# Patient Record
Sex: Female | Born: 1980 | Race: White | Hispanic: No | Marital: Married | Smoking: Never smoker
Health system: Southern US, Community
[De-identification: ages and names within clinical notes are randomized; demographics above are authoritative.]

## PROBLEM LIST (undated history)

## (undated) DIAGNOSIS — J45909 Unspecified asthma, uncomplicated: Secondary | ICD-10-CM

## (undated) DIAGNOSIS — D649 Anemia, unspecified: Secondary | ICD-10-CM

## (undated) DIAGNOSIS — R519 Headache, unspecified: Secondary | ICD-10-CM

## (undated) DIAGNOSIS — N2889 Other specified disorders of kidney and ureter: Secondary | ICD-10-CM

## (undated) DIAGNOSIS — L709 Acne, unspecified: Secondary | ICD-10-CM

## (undated) HISTORY — PX: WISDOM TOOTH EXTRACTION: SHX21

---

## 2010-05-30 ENCOUNTER — Inpatient Hospital Stay (HOSPITAL_COMMUNITY): Admission: RE | Admit: 2010-05-30 | Discharge: 2010-06-01 | Payer: Self-pay | Admitting: Obstetrics and Gynecology

## 2011-01-21 LAB — CBC
Hemoglobin: 9.9 g/dL — ABNORMAL LOW (ref 12.0–15.0)
MCH: 27.3 pg (ref 26.0–34.0)
Platelets: 150 10*3/uL (ref 150–400)
Platelets: 194 10*3/uL (ref 150–400)
RBC: 3.63 MIL/uL — ABNORMAL LOW (ref 3.87–5.11)
RBC: 4.66 MIL/uL (ref 3.87–5.11)
RDW: 16.4 % — ABNORMAL HIGH (ref 11.5–15.5)
WBC: 14.9 10*3/uL — ABNORMAL HIGH (ref 4.0–10.5)
WBC: 9.7 10*3/uL (ref 4.0–10.5)

## 2011-01-21 LAB — RPR: RPR Ser Ql: NONREACTIVE

## 2012-11-06 NOTE — L&D Delivery Note (Signed)
Delivery Note At 10:07 PM a viable female was delivered via Vaginal, Spontaneous Delivery  Apgars 9 9  Moderate Shoulder dystocia relieved with mcroberts and suprapubic press  Placenta status spontaneously  Intact 3 vessel cord   Anesthesia: Epidural  Episiotomy: midline Lacerations: none Suture Repair: 2.0 3.0 chromic vicryl Est. Blood Loss (mL): 300  Mom to postpartum.  Baby to nursery-stable.  Brandi Suarez L 08/12/2013, 10:20 PM

## 2013-01-03 LAB — OB RESULTS CONSOLE ANTIBODY SCREEN: Antibody Screen: NEGATIVE

## 2013-01-03 LAB — OB RESULTS CONSOLE ABO/RH

## 2013-01-03 LAB — OB RESULTS CONSOLE RUBELLA ANTIBODY, IGM: Rubella: IMMUNE

## 2013-01-15 LAB — OB RESULTS CONSOLE GC/CHLAMYDIA
Chlamydia: NEGATIVE
Gonorrhea: NEGATIVE

## 2013-07-08 LAB — OB RESULTS CONSOLE GBS: GBS: NEGATIVE

## 2013-08-12 ENCOUNTER — Encounter (HOSPITAL_COMMUNITY): Payer: Self-pay | Admitting: Anesthesiology

## 2013-08-12 ENCOUNTER — Encounter (HOSPITAL_COMMUNITY): Payer: Self-pay | Admitting: *Deleted

## 2013-08-12 ENCOUNTER — Inpatient Hospital Stay (HOSPITAL_COMMUNITY)
Admission: AD | Admit: 2013-08-12 | Discharge: 2013-08-14 | DRG: 373 | Disposition: A | Discharge status: Home / Self Care | Payer: BC Managed Care – PPO | Source: Ambulatory Visit | Attending: Obstetrics and Gynecology | Admitting: Obstetrics and Gynecology

## 2013-08-12 ENCOUNTER — Inpatient Hospital Stay (HOSPITAL_COMMUNITY): Payer: BC Managed Care – PPO | Admitting: Anesthesiology

## 2013-08-12 DIAGNOSIS — O3660X0 Maternal care for excessive fetal growth, unspecified trimester, not applicable or unspecified: Principal | ICD-10-CM | POA: Diagnosis present

## 2013-08-12 DIAGNOSIS — O360990 Maternal care for other rhesus isoimmunization, unspecified trimester, not applicable or unspecified: Secondary | ICD-10-CM | POA: Diagnosis present

## 2013-08-12 HISTORY — DX: Unspecified asthma, uncomplicated: J45.909

## 2013-08-12 LAB — CBC
HCT: 36.5 % (ref 36.0–46.0)
MCV: 74.6 fL — ABNORMAL LOW (ref 78.0–100.0)
RBC: 4.89 MIL/uL (ref 3.87–5.11)
RDW: 14.8 % (ref 11.5–15.5)
WBC: 12.5 10*3/uL — ABNORMAL HIGH (ref 4.0–10.5)

## 2013-08-12 MED ORDER — DIPHENHYDRAMINE HCL 50 MG/ML IJ SOLN: 12.5000 mg | INTRAMUSCULAR | Status: DC | PRN

## 2013-08-12 MED ORDER — LIDOCAINE HCL (PF) 1 % IJ SOLN
30.0000 mL | INTRAMUSCULAR | Status: DC | PRN
  Administered 2013-08-12: 30 mL via SUBCUTANEOUS
  Filled 2013-08-12 (×2): qty 30

## 2013-08-12 MED ORDER — PHENYLEPHRINE 40 MCG/ML (10ML) SYRINGE FOR IV PUSH (FOR BLOOD PRESSURE SUPPORT)
80.0000 ug | PREFILLED_SYRINGE | INTRAVENOUS | Status: DC | PRN
  Filled 2013-08-12: qty 5

## 2013-08-12 MED ORDER — SODIUM BICARBONATE 8.4 % IV SOLN
INTRAVENOUS | Status: DC | PRN
  Administered 2013-08-12: 5 mL via EPIDURAL
  Administered 2013-08-12: 2 mL via EPIDURAL

## 2013-08-12 MED ORDER — BISACODYL 10 MG RE SUPP
10.0000 mg | Freq: Every day | RECTAL | Status: DC | PRN
  Filled 2013-08-12: qty 1

## 2013-08-12 MED ORDER — MEDROXYPROGESTERONE ACETATE 150 MG/ML IM SUSP: 150.0000 mg | INTRAMUSCULAR | Status: DC | PRN

## 2013-08-12 MED ORDER — LANOLIN HYDROUS EX OINT: TOPICAL_OINTMENT | CUTANEOUS | Status: DC | PRN

## 2013-08-12 MED ORDER — FLEET ENEMA 7-19 GM/118ML RE ENEM: 1.0000 | ENEMA | Freq: Every day | RECTAL | Status: DC | PRN

## 2013-08-12 MED ORDER — OXYTOCIN BOLUS FROM INFUSION
500.0000 mL | INTRAVENOUS | Status: DC
  Administered 2013-08-12: 500 mL via INTRAVENOUS

## 2013-08-12 MED ORDER — DIPHENHYDRAMINE HCL 25 MG PO CAPS: 25.0000 mg | ORAL_CAPSULE | Freq: Four times a day (QID) | ORAL | Status: DC | PRN

## 2013-08-12 MED ORDER — EPHEDRINE 5 MG/ML INJ
10.0000 mg | INTRAVENOUS | Status: DC | PRN
  Filled 2013-08-12: qty 4

## 2013-08-12 MED ORDER — SIMETHICONE 80 MG PO CHEW: 80.0000 mg | CHEWABLE_TABLET | ORAL | Status: DC | PRN

## 2013-08-12 MED ORDER — OXYCODONE-ACETAMINOPHEN 5-325 MG PO TABS: 1.0000 | ORAL_TABLET | ORAL | Status: DC | PRN

## 2013-08-12 MED ORDER — DIBUCAINE 1 % RE OINT
1.0000 "application " | TOPICAL_OINTMENT | RECTAL | Status: DC | PRN
  Filled 2013-08-12: qty 28

## 2013-08-12 MED ORDER — LACTATED RINGERS IV SOLN: 500.0000 mL | INTRAVENOUS | Status: DC | PRN

## 2013-08-12 MED ORDER — BUPIVACAINE HCL (PF) 0.25 % IJ SOLN
INTRAMUSCULAR | Status: DC | PRN
  Administered 2013-08-12: 2 mL

## 2013-08-12 MED ORDER — ONDANSETRON HCL 4 MG PO TABS: 4.0000 mg | ORAL_TABLET | ORAL | Status: DC | PRN

## 2013-08-12 MED ORDER — PHENYLEPHRINE 40 MCG/ML (10ML) SYRINGE FOR IV PUSH (FOR BLOOD PRESSURE SUPPORT): 80.0000 ug | PREFILLED_SYRINGE | INTRAVENOUS | Status: DC | PRN

## 2013-08-12 MED ORDER — SENNOSIDES-DOCUSATE SODIUM 8.6-50 MG PO TABS: 2.0000 | ORAL_TABLET | ORAL | Status: DC

## 2013-08-12 MED ORDER — PRENATAL MULTIVITAMIN CH
1.0000 | ORAL_TABLET | Freq: Every day | ORAL | Status: DC
  Administered 2013-08-13: 1 via ORAL

## 2013-08-12 MED ORDER — OXYTOCIN 40 UNITS IN LACTATED RINGERS INFUSION - SIMPLE MED
62.5000 mL/h | INTRAVENOUS | Status: DC
Start: 2013-08-12 — End: 2013-08-12
  Administered 2013-08-12: 62.5 mL/h via INTRAVENOUS
  Filled 2013-08-12: qty 1000

## 2013-08-12 MED ORDER — MEASLES, MUMPS & RUBELLA VAC ~~LOC~~ INJ
0.5000 mL | INJECTION | Freq: Once | SUBCUTANEOUS | Status: DC
  Filled 2013-08-12: qty 0.5

## 2013-08-12 MED ORDER — CITRIC ACID-SODIUM CITRATE 334-500 MG/5ML PO SOLN: 30.0000 mL | ORAL | Status: DC | PRN

## 2013-08-12 MED ORDER — BENZOCAINE-MENTHOL 20-0.5 % EX AERO
1.0000 "application " | INHALATION_SPRAY | CUTANEOUS | Status: DC | PRN
  Administered 2013-08-13: 1 via TOPICAL
  Filled 2013-08-12 (×2): qty 56

## 2013-08-12 MED ORDER — LACTATED RINGERS IV SOLN
INTRAVENOUS | Status: DC
  Administered 2013-08-12 (×2): via INTRAVENOUS

## 2013-08-12 MED ORDER — ZOLPIDEM TARTRATE 5 MG PO TABS: 5.0000 mg | ORAL_TABLET | Freq: Every evening | ORAL | Status: DC | PRN

## 2013-08-12 MED ORDER — IBUPROFEN 600 MG PO TABS: 600.0000 mg | ORAL_TABLET | Freq: Four times a day (QID) | ORAL | Status: DC | PRN

## 2013-08-12 MED ORDER — ACETAMINOPHEN 325 MG PO TABS: 650.0000 mg | ORAL_TABLET | ORAL | Status: DC | PRN

## 2013-08-12 MED ORDER — IBUPROFEN 600 MG PO TABS
600.0000 mg | ORAL_TABLET | Freq: Four times a day (QID) | ORAL | Status: DC
  Administered 2013-08-13 – 2013-08-14 (×6): 600 mg via ORAL
  Filled 2013-08-12 (×5): qty 1

## 2013-08-12 MED ORDER — TETANUS-DIPHTH-ACELL PERTUSSIS 5-2.5-18.5 LF-MCG/0.5 IM SUSP
0.5000 mL | Freq: Once | INTRAMUSCULAR | Status: DC
  Filled 2013-08-12: qty 0.5

## 2013-08-12 MED ORDER — FENTANYL 2.5 MCG/ML BUPIVACAINE 1/10 % EPIDURAL INFUSION (WH - ANES)
14.0000 mL/h | INTRAMUSCULAR | Status: DC | PRN
  Administered 2013-08-12 (×2): 14 mL/h via EPIDURAL
  Filled 2013-08-12 (×2): qty 125

## 2013-08-12 MED ORDER — LACTATED RINGERS IV SOLN
500.0000 mL | Freq: Once | INTRAVENOUS | Status: AC
  Administered 2013-08-12: 500 mL via INTRAVENOUS

## 2013-08-12 MED ORDER — ONDANSETRON HCL 4 MG/2ML IJ SOLN: 4.0000 mg | Freq: Four times a day (QID) | INTRAMUSCULAR | Status: DC | PRN

## 2013-08-12 MED ORDER — ONDANSETRON HCL 4 MG/2ML IJ SOLN: 4.0000 mg | INTRAMUSCULAR | Status: DC | PRN

## 2013-08-12 MED ORDER — WITCH HAZEL-GLYCERIN EX PADS: 1.0000 "application " | MEDICATED_PAD | CUTANEOUS | Status: DC | PRN

## 2013-08-12 MED ORDER — EPHEDRINE 5 MG/ML INJ: 10.0000 mg | INTRAVENOUS | Status: DC | PRN

## 2013-08-12 NOTE — H&P (Signed)
Brandi Suarez is a 32 y.o. female presenting for onset of labor. Ultrasound today in office EFW is 4211 gram. Patient counseled about possibility of LGA and shoulder dystocia and very much wants to have vaginal delivery. Was 5 to 6 cm dilated in the office. Now status post Epidural. Maternal Medical History:  Reason for admission: Contractions.   Contractions: Frequency: regular.   Perceived severity is moderate.    Fetal activity: Perceived fetal activity is normal.    Prenatal complications: no prenatal complications   OB History   Grav Para Term Preterm Abortions TAB SAB Ect Mult Living   2 1 1       1      Past Medical History  Diagnosis Date  . Asthma     had when younger "outgrown" now   History reviewed. No pertinent past surgical history. Family History: family history is not on file. Social History:  reports that she has never smoked. She has never used smokeless tobacco. She reports that she does not drink alcohol or use illicit drugs.   Prenatal Transfer Tool  Maternal Diabetes: No Genetic Screening: Normal Maternal Ultrasounds/Referrals: Normal Fetal Ultrasounds or other Referrals:  None Maternal Substance Abuse:  No Significant Maternal Medications:  None Significant Maternal Lab Results:  None Other Comments:  None  Review of Systems  All other systems reviewed and are negative.    Dilation: 7.5 Effacement (%): 90 Station: -2 Exam by:: Dr. Vincente Poli Blood pressure 120/66, pulse 78, temperature 98.8 F (37.1 C), temperature source Oral, resp. rate 18, height 5' 2.5" (1.588 m), weight 75.297 kg (166 lb), last menstrual period 11/07/2012. Maternal Exam:  Uterine Assessment: Contraction strength is moderate.  Contraction frequency is regular.   Abdomen: Estimated fetal weight is 4211 gram by ultrasound today.   Fetal presentation: vertex  Introitus: Normal vulva. Normal vagina.    Physical Exam  Nursing note and vitals reviewed. Constitutional: She  appears well-developed and well-nourished.  Cardiovascular: Normal rate.   Respiratory: Effort normal and breath sounds normal.    Prenatal labs: ABO, Rh:  RH negative  Antibody:   Rubella:  immune RPR:  neg  HBsAg:   neg HIV:   neg GBS:     Assessment/Plan: IUP at term LGA  Anticipate NSVD   Christa Fasig L 08/12/2013, 3:34 PM

## 2013-08-12 NOTE — Anesthesia Preprocedure Evaluation (Addendum)

## 2013-08-12 NOTE — Anesthesia Procedure Notes (Signed)

## 2013-08-13 LAB — CBC
HCT: 30.6 % — ABNORMAL LOW (ref 36.0–46.0)
MCHC: 32 g/dL (ref 30.0–36.0)
MCV: 73.9 fL — ABNORMAL LOW (ref 78.0–100.0)
Platelets: 207 10*3/uL (ref 150–400)
RBC: 4.14 MIL/uL (ref 3.87–5.11)
WBC: 21.4 10*3/uL — ABNORMAL HIGH (ref 4.0–10.5)

## 2013-08-13 MED ORDER — INFLUENZA VAC SPLIT QUAD 0.5 ML IM SUSP
0.5000 mL | INTRAMUSCULAR | Status: AC
  Administered 2013-08-14: 0.5 mL via INTRAMUSCULAR
  Filled 2013-08-13: qty 0.5

## 2013-08-13 NOTE — Progress Notes (Signed)
Post Partum Day 1 Subjective: up ad lib, voiding, tolerating PO and complains of mild pulling sensation RLQ  Objective: Blood pressure 132/84, pulse 60, temperature 97.7 F (36.5 C), temperature source Axillary, resp. rate 18, height 5' 2.5" (1.588 m), weight 166 lb (75.297 kg), last menstrual period 11/07/2012, SpO2 98.00%, unknown if currently breastfeeding.  Physical Exam:  General: alert and cooperative Lochia: appropriate Uterine Fundus: firm Incision: perineum intact DVT Evaluation: No evidence of DVT seen on physical exam. Negative Homan's sign. No cords or calf tenderness. No significant calf/ankle edema.   Recent Labs  08/12/13 1400 08/13/13 0550  HGB 11.7* 9.8*  HCT 36.5 30.6*    Assessment/Plan: Plan for discharge tomorrow and Circumcision prior to discharge  K pad if desired   LOS: 1 day   Ariyah Sedlack G 08/13/2013, 8:43 AM

## 2013-08-13 NOTE — Anesthesia Postprocedure Evaluation (Signed)
Anesthesia Post Note  Patient: Brandi Suarez  Procedure(s) Performed: * No procedures listed *  Anesthesia type: Epidural  Patient location: Mother/Baby  Post pain: Pain level controlled  Post assessment: Post-op Vital signs reviewed  Last Vitals:  Filed Vitals:   08/13/13 0605  BP: 132/84  Pulse: 60  Temp: 36.5 C  Resp: 18    Post vital signs: Reviewed  Level of consciousness:alert  Complications: No apparent anesthesia complications

## 2013-08-13 NOTE — Lactation Note (Signed)
This note was copied from the chart of Brandi Suarez. Lactation Consultation Note  Patient Name: Brandi Suarez [id number] Date: 08/13/2013 Reason for consult: Initial assessment Mom reports baby has latched well at least 3 times since birth. He had his circumcision this afternoon and is asleep at this visit. BF basics reviewed with Mom. Encouraged to BF with feeding ques, at least every 3 hours attempt to feed baby. Cluster feeding discussed. Lactation brochure left for review. Advised of OP services and support group. Advised to call if would like LC assist.   Maternal Data Formula Feeding for Exclusion: No Infant to breast within first hour of birth: Yes Has patient been taught Hand Expression?: No (Mom reports she knows how to hand express) Does the patient have breastfeeding experience prior to this delivery?: Yes  Feeding    LATCH Score/Interventions                      Lactation Tools Discussed/Used WIC Program: No   Consult Status Consult Status: Follow-up Date: 08/14/13 Follow-up type: In-patient    Alfred Levins 08/13/2013, 2:48 PM

## 2013-08-13 NOTE — Progress Notes (Signed)
Post Partum Day 1 Subjective: no complaints, up ad lib, voiding and tolerating PO.  Patient requests circ.  Objective: Blood pressure 132/84, pulse 60, temperature 97.7 F (36.5 C), temperature source Axillary, resp. rate 18, height 5' 2.5" (1.588 m), weight 166 lb (75.297 kg), last menstrual period 11/07/2012, SpO2 98.00%, unknown if currently breastfeeding.  Physical Exam:  General: alert, cooperative and appears stated age Lochia: appropriate Uterine Fundus: firm Incision: n/a DVT Evaluation: No evidence of DVT seen on physical exam. Negative Homan's sign. No cords or calf tenderness.   Recent Labs  08/12/13 1400 08/13/13 0550  HGB 11.7* 9.8*  HCT 36.5 30.6*    Assessment/Plan: Plan for discharge tomorrow, Breastfeeding and Circumcision prior to discharge Patient and husband counseled for circumcision including risk of bleeding, infection, and scarring.  All questions were answered.  Will do today.   LOS: 1 day   Brandi Suarez 08/13/2013, 8:46 AM

## 2013-08-14 MED ORDER — IBUPROFEN 600 MG PO TABS: 600.0000 mg | ORAL_TABLET | Freq: Four times a day (QID) | ORAL | Status: DC

## 2013-08-14 MED ORDER — OXYCODONE-ACETAMINOPHEN 5-325 MG PO TABS: 1.0000 | ORAL_TABLET | ORAL | Status: DC | PRN

## 2013-08-14 NOTE — Discharge Summary (Signed)
Obstetric Discharge Summary Reason for Admission: onset of labor Prenatal Procedures: ultrasound Intrapartum Procedures: spontaneous vaginal delivery Postpartum Procedures: none Complications-Operative and Postpartum: ML episiotomy Hemoglobin  Date Value Range Status  08/13/2013 9.8* 12.0 - 15.0 g/dL Final     REPEATED TO VERIFY     HCT  Date Value Range Status  08/13/2013 30.6* 36.0 - 46.0 % Final     REPEATED TO VERIFY    Physical Exam:  General: alert and cooperative Lochia: appropriate Uterine Fundus: firm Incision: perineum intact DVT Evaluation: No evidence of DVT seen on physical exam. Negative Homan's sign. No cords or calf tenderness. No significant calf/ankle edema.  Discharge Diagnoses: Term Pregnancy-delivered  Discharge Information: Date: 08/14/2013 Activity: pelvic rest Diet: routine Medications: PNV, Ibuprofen and Percocet Condition: stable Instructions: refer to practice specific booklet Discharge to: home   Newborn Data: Live born female  Birth Weight: 9 lb 6.6 oz (4270 g) APGAR: 8, 10  Home with mother.  CURTIS,CAROL G 08/14/2013, 8:52 AM

## 2013-08-14 NOTE — Lactation Note (Signed)
This note was copied from the chart of Brandi Jeily Weissman. Lactation Consultation Note  Patient Name: Brandi Suarez RUEAV'W Date: 08/14/2013 Reason for consult: Follow-up assessment Mom reports some mild nipple tenderness, no breakdown. Care for sore nipples discussed. Advised to apply EBM for tenderness. Mom had baby latched when I arrived, demonstrated how to bring bottom lip down for deeper latch. Engorgement care reviewed if needed. Advised of OP services and support group.   Maternal Data    Feeding Feeding Type: Breast Fed Length of feed: 35 min  LATCH Score/Interventions Latch: Grasps breast easily, tongue down, lips flanged, rhythmical sucking.  Audible Swallowing: A few with stimulation  Type of Nipple: Everted at rest and after stimulation  Comfort (Breast/Nipple): Filling, red/small blisters or bruises, mild/mod discomfort  Problem noted: Mild/Moderate discomfort  Hold (Positioning): No assistance needed to correctly position infant at breast.  LATCH Score: 8  Lactation Tools Discussed/Used     Consult Status Consult Status: Complete Date: 08/14/13 Follow-up type: In-patient    Brandi Suarez 08/14/2013, 8:48 AM

## 2014-09-07 ENCOUNTER — Encounter (HOSPITAL_COMMUNITY): Payer: Self-pay | Admitting: *Deleted

## 2014-12-03 ENCOUNTER — Other Ambulatory Visit (HOSPITAL_COMMUNITY): Payer: Self-pay | Admitting: Obstetrics and Gynecology

## 2014-12-03 DIAGNOSIS — R109 Unspecified abdominal pain: Secondary | ICD-10-CM

## 2014-12-07 ENCOUNTER — Ambulatory Visit (HOSPITAL_COMMUNITY)
Admission: RE | Admit: 2014-12-07 | Discharge: 2014-12-07 | Disposition: A | Discharge status: Home / Self Care | Payer: BLUE CROSS/BLUE SHIELD | Source: Ambulatory Visit | Attending: Obstetrics and Gynecology | Admitting: Obstetrics and Gynecology

## 2014-12-07 DIAGNOSIS — N2889 Other specified disorders of kidney and ureter: Secondary | ICD-10-CM | POA: Diagnosis not present

## 2014-12-07 DIAGNOSIS — R109 Unspecified abdominal pain: Secondary | ICD-10-CM | POA: Insufficient documentation

## 2015-03-09 ENCOUNTER — Other Ambulatory Visit (HOSPITAL_COMMUNITY): Payer: Self-pay | Admitting: Urology

## 2015-03-09 DIAGNOSIS — Q6239 Other obstructive defects of renal pelvis and ureter: Secondary | ICD-10-CM

## 2015-03-09 DIAGNOSIS — Q6211 Congenital occlusion of ureteropelvic junction: Principal | ICD-10-CM

## 2015-03-18 ENCOUNTER — Encounter (HOSPITAL_COMMUNITY): Payer: Self-pay

## 2015-03-18 ENCOUNTER — Ambulatory Visit (HOSPITAL_COMMUNITY)
Admission: RE | Admit: 2015-03-18 | Discharge: 2015-03-18 | Disposition: A | Discharge status: Home / Self Care | Payer: BLUE CROSS/BLUE SHIELD | Source: Ambulatory Visit | Attending: Urology | Admitting: Urology

## 2015-03-18 DIAGNOSIS — Q6239 Other obstructive defects of renal pelvis and ureter: Secondary | ICD-10-CM

## 2015-03-18 DIAGNOSIS — Q6211 Congenital occlusion of ureteropelvic junction: Secondary | ICD-10-CM

## 2015-03-18 DIAGNOSIS — R109 Unspecified abdominal pain: Secondary | ICD-10-CM | POA: Insufficient documentation

## 2015-03-18 MED ORDER — FUROSEMIDE 10 MG/ML IJ SOLN
28.6000 mg | Freq: Once | INTRAMUSCULAR | Status: AC
  Administered 2015-03-18: 28.6 mg via INTRAVENOUS
  Filled 2015-03-18: qty 4

## 2015-03-18 MED ORDER — TECHNETIUM TC 99M MERTIATIDE
15.0000 | Freq: Once | INTRAVENOUS | Status: AC | PRN
  Administered 2015-03-18: 15.1 via INTRAVENOUS

## 2015-03-18 MED ORDER — FUROSEMIDE 10 MG/ML IJ SOLN: 37.7000 mg | Freq: Once | INTRAMUSCULAR | Status: DC

## 2016-01-13 ENCOUNTER — Other Ambulatory Visit: Payer: Self-pay | Admitting: Urology

## 2016-02-01 ENCOUNTER — Encounter (HOSPITAL_BASED_OUTPATIENT_CLINIC_OR_DEPARTMENT_OTHER): Payer: Self-pay | Admitting: *Deleted

## 2016-02-01 NOTE — Progress Notes (Signed)
NPO AFTER MN.  ARRIVE AT [id number].  NEEDS ISTAT 8  AND URINE PREG.

## 2016-02-04 ENCOUNTER — Encounter (HOSPITAL_BASED_OUTPATIENT_CLINIC_OR_DEPARTMENT_OTHER)
Admission: RE | Disposition: A | Discharge status: Home / Self Care | Payer: Self-pay | Source: Ambulatory Visit | Attending: Urology

## 2016-02-04 ENCOUNTER — Ambulatory Visit (HOSPITAL_BASED_OUTPATIENT_CLINIC_OR_DEPARTMENT_OTHER)
Admission: RE | Admit: 2016-02-04 | Discharge: 2016-02-04 | Disposition: A | Discharge status: Home / Self Care | Payer: BLUE CROSS/BLUE SHIELD | Source: Ambulatory Visit | Attending: Urology | Admitting: Urology

## 2016-02-04 ENCOUNTER — Ambulatory Visit (HOSPITAL_BASED_OUTPATIENT_CLINIC_OR_DEPARTMENT_OTHER): Payer: BLUE CROSS/BLUE SHIELD | Admitting: Anesthesiology

## 2016-02-04 ENCOUNTER — Encounter (HOSPITAL_BASED_OUTPATIENT_CLINIC_OR_DEPARTMENT_OTHER): Payer: Self-pay | Admitting: *Deleted

## 2016-02-04 DIAGNOSIS — N132 Hydronephrosis with renal and ureteral calculous obstruction: Secondary | ICD-10-CM | POA: Insufficient documentation

## 2016-02-04 DIAGNOSIS — Q625 Duplication of ureter: Secondary | ICD-10-CM | POA: Insufficient documentation

## 2016-02-04 DIAGNOSIS — R109 Unspecified abdominal pain: Secondary | ICD-10-CM | POA: Insufficient documentation

## 2016-02-04 HISTORY — DX: Other specified disorders of kidney and ureter: N28.89

## 2016-02-04 HISTORY — PX: CYSTOSCOPY WITH RETROGRADE PYELOGRAM, URETEROSCOPY AND STENT PLACEMENT: SHX5789

## 2016-02-04 HISTORY — DX: Acne, unspecified: L70.9

## 2016-02-04 LAB — POCT I-STAT, CHEM 8
BUN: 8 mg/dL (ref 6–20)
CALCIUM ION: 1.24 mmol/L — AB (ref 1.12–1.23)
CHLORIDE: 104 mmol/L (ref 101–111)
Creatinine, Ser: 0.7 mg/dL (ref 0.44–1.00)
Glucose, Bld: 96 mg/dL (ref 65–99)
HCT: 45 % (ref 36.0–46.0)
Hemoglobin: 15.3 g/dL — ABNORMAL HIGH (ref 12.0–15.0)
POTASSIUM: 3.9 mmol/L (ref 3.5–5.1)
SODIUM: 143 mmol/L (ref 135–145)
TCO2: 23 mmol/L (ref 0–100)

## 2016-02-04 LAB — POCT PREGNANCY, URINE: PREG TEST UR: NEGATIVE

## 2016-02-04 SURGERY — CYSTOURETEROSCOPY, WITH RETROGRADE PYELOGRAM AND STENT INSERTION
Anesthesia: General | Site: Ureter | Laterality: Right

## 2016-02-04 MED ORDER — SODIUM CHLORIDE 0.9 % IR SOLN
Status: DC | PRN
  Administered 2016-02-04: 3000 mL
  Administered 2016-02-04: 1000 mL

## 2016-02-04 MED ORDER — IOPAMIDOL (ISOVUE-300) INJECTION 61%
INTRAVENOUS | Status: DC | PRN
  Administered 2016-02-04: 10 mL

## 2016-02-04 MED ORDER — OXYCODONE-ACETAMINOPHEN 5-325 MG PO TABS: 1.0000 | ORAL_TABLET | Freq: Four times a day (QID) | ORAL | Status: DC | PRN

## 2016-02-04 MED ORDER — ONDANSETRON HCL 4 MG/2ML IJ SOLN
INTRAMUSCULAR | Status: AC
  Filled 2016-02-04: qty 2

## 2016-02-04 MED ORDER — MIDAZOLAM HCL 5 MG/5ML IJ SOLN
INTRAMUSCULAR | Status: DC | PRN
  Administered 2016-02-04: 2 mg via INTRAVENOUS

## 2016-02-04 MED ORDER — PROPOFOL 10 MG/ML IV BOLUS
INTRAVENOUS | Status: AC
  Filled 2016-02-04: qty 20

## 2016-02-04 MED ORDER — DEXAMETHASONE SODIUM PHOSPHATE 10 MG/ML IJ SOLN
INTRAMUSCULAR | Status: AC
  Filled 2016-02-04: qty 1

## 2016-02-04 MED ORDER — GENTAMICIN SULFATE 40 MG/ML IJ SOLN
5.0000 mg/kg | INTRAVENOUS | Status: AC
  Administered 2016-02-04: 270 mg via INTRAVENOUS
  Filled 2016-02-04: qty 6.75

## 2016-02-04 MED ORDER — LACTATED RINGERS IV SOLN
INTRAVENOUS | Status: DC
  Administered 2016-02-04 (×2): via INTRAVENOUS
  Filled 2016-02-04: qty 1000

## 2016-02-04 MED ORDER — ACETAMINOPHEN 160 MG/5ML PO SOLN
975.0000 mg | Freq: Once | ORAL | Status: AC
  Administered 2016-02-04: 975 mg via ORAL
  Filled 2016-02-04: qty 30.5

## 2016-02-04 MED ORDER — ONDANSETRON HCL 4 MG/2ML IJ SOLN
4.0000 mg | Freq: Once | INTRAMUSCULAR | Status: AC
  Administered 2016-02-04: 4 mg via INTRAVENOUS
  Filled 2016-02-04: qty 2

## 2016-02-04 MED ORDER — PROMETHAZINE HCL 25 MG/ML IJ SOLN
INTRAMUSCULAR | Status: AC
  Filled 2016-02-04: qty 1

## 2016-02-04 MED ORDER — KETOROLAC TROMETHAMINE 30 MG/ML IJ SOLN
30.0000 mg | Freq: Once | INTRAMUSCULAR | Status: AC
  Administered 2016-02-04: 30 mg via INTRAVENOUS
  Filled 2016-02-04: qty 1

## 2016-02-04 MED ORDER — GENTAMICIN IN SALINE 1.6-0.9 MG/ML-% IV SOLN
80.0000 mg | INTRAVENOUS | Status: DC
  Filled 2016-02-04: qty 50

## 2016-02-04 MED ORDER — FENTANYL CITRATE (PF) 100 MCG/2ML IJ SOLN
INTRAMUSCULAR | Status: DC | PRN
  Administered 2016-02-04 (×2): 50 ug via INTRAVENOUS

## 2016-02-04 MED ORDER — DEXAMETHASONE SODIUM PHOSPHATE 4 MG/ML IJ SOLN
INTRAMUSCULAR | Status: DC | PRN
  Administered 2016-02-04: 10 mg via INTRAVENOUS

## 2016-02-04 MED ORDER — OXYCODONE HCL 5 MG PO TABS
ORAL_TABLET | ORAL | Status: AC
  Filled 2016-02-04: qty 1

## 2016-02-04 MED ORDER — FENTANYL CITRATE (PF) 100 MCG/2ML IJ SOLN
INTRAMUSCULAR | Status: AC
  Filled 2016-02-04: qty 2

## 2016-02-04 MED ORDER — PROMETHAZINE HCL 25 MG/ML IJ SOLN
6.2500 mg | Freq: Two times a day (BID) | INTRAMUSCULAR | Status: DC | PRN
  Filled 2016-02-04: qty 1

## 2016-02-04 MED ORDER — LIDOCAINE HCL (CARDIAC) 20 MG/ML IV SOLN
INTRAVENOUS | Status: AC
  Filled 2016-02-04: qty 5

## 2016-02-04 MED ORDER — SENNOSIDES-DOCUSATE SODIUM 8.6-50 MG PO TABS: 1.0000 | ORAL_TABLET | Freq: Two times a day (BID) | ORAL | Status: DC

## 2016-02-04 MED ORDER — CEPHALEXIN 500 MG PO CAPS: 500.0000 mg | ORAL_CAPSULE | Freq: Two times a day (BID) | ORAL | Status: DC

## 2016-02-04 MED ORDER — ACETAMINOPHEN 160 MG/5ML PO SOLN
ORAL | Status: AC
  Filled 2016-02-04: qty 40.6

## 2016-02-04 MED ORDER — OXYCODONE HCL 5 MG PO TABS
5.0000 mg | ORAL_TABLET | Freq: Once | ORAL | Status: AC
  Administered 2016-02-04: 5 mg via ORAL
  Filled 2016-02-04: qty 1

## 2016-02-04 MED ORDER — ONDANSETRON HCL 4 MG/2ML IJ SOLN
INTRAMUSCULAR | Status: DC | PRN
  Administered 2016-02-04: 4 mg via INTRAVENOUS

## 2016-02-04 MED ORDER — KETOROLAC TROMETHAMINE 30 MG/ML IJ SOLN
INTRAMUSCULAR | Status: AC
  Filled 2016-02-04: qty 1

## 2016-02-04 MED ORDER — MIDAZOLAM HCL 2 MG/2ML IJ SOLN
INTRAMUSCULAR | Status: AC
  Filled 2016-02-04: qty 2

## 2016-02-04 MED ORDER — LIDOCAINE HCL (CARDIAC) 20 MG/ML IV SOLN
INTRAVENOUS | Status: DC | PRN
  Administered 2016-02-04: 50 mg via INTRAVENOUS

## 2016-02-04 MED ORDER — PROPOFOL 10 MG/ML IV BOLUS
INTRAVENOUS | Status: DC | PRN
  Administered 2016-02-04: 80 mg via INTRAVENOUS
  Administered 2016-02-04: 120 mg via INTRAVENOUS

## 2016-02-04 MED ORDER — FENTANYL CITRATE (PF) 100 MCG/2ML IJ SOLN
25.0000 ug | INTRAMUSCULAR | Status: DC | PRN
  Administered 2016-02-04 (×2): 25 ug via INTRAVENOUS
  Filled 2016-02-04: qty 1

## 2016-02-04 MED ORDER — KETOROLAC TROMETHAMINE 10 MG PO TABS: 10.0000 mg | ORAL_TABLET | Freq: Four times a day (QID) | ORAL | Status: DC | PRN

## 2016-02-04 MED FILL — KETOROLAC 10 MG TABLET: 10 | 5 days supply | Qty: 20 | Fill #0

## 2016-02-04 MED FILL — CEPHALEXIN 500 MG CAPSULE: 500 | 4 days supply | Qty: 8 | Fill #0

## 2016-02-04 MED FILL — OXYCODONE/APAP 5/325MG: 5-325 | 4 days supply | Qty: 30 | Fill #0

## 2016-02-04 SURGICAL SUPPLY — 27 items
BAG DRAIN URO-CYSTO SKYTR STRL (DRAIN) ×3 IMPLANT
BASKET DAKOTA 1.9FR 11X120 (BASKET) ×3 IMPLANT
CATH INTERMIT  6FR 70CM (CATHETERS) ×3 IMPLANT
CLOTH BEACON ORANGE TIMEOUT ST (SAFETY) ×3 IMPLANT
FIBER LASER TRAC TIP (UROLOGICAL SUPPLIES) IMPLANT
GLOVE BIO SURGEON STRL SZ 6.5 (GLOVE) ×2 IMPLANT
GLOVE BIO SURGEON STRL SZ7.5 (GLOVE) ×6 IMPLANT
GLOVE BIO SURGEONS STRL SZ 6.5 (GLOVE) ×1
GLOVE INDICATOR 6.5 STRL GRN (GLOVE) ×3 IMPLANT
GLOVE INDICATOR 7.5 STRL GRN (GLOVE) ×3 IMPLANT
GOWN STRL REUS W/ TWL LRG LVL3 (GOWN DISPOSABLE) ×2 IMPLANT
GOWN STRL REUS W/ TWL XL LVL3 (GOWN DISPOSABLE) ×1 IMPLANT
GOWN STRL REUS W/TWL LRG LVL3 (GOWN DISPOSABLE) ×4
GOWN STRL REUS W/TWL XL LVL3 (GOWN DISPOSABLE) ×2
GUIDEWIRE ANG ZIPWIRE 038X150 (WIRE) ×3 IMPLANT
GUIDEWIRE STR DUAL SENSOR (WIRE) ×3 IMPLANT
IV NS 1000ML (IV SOLUTION) ×2
IV NS 1000ML BAXH (IV SOLUTION) ×1 IMPLANT
IV NS IRRIG 3000ML ARTHROMATIC (IV SOLUTION) ×3 IMPLANT
KIT ROOM TURNOVER WOR (KITS) ×3 IMPLANT
MANIFOLD NEPTUNE II (INSTRUMENTS) ×3 IMPLANT
PACK CYSTO (CUSTOM PROCEDURE TRAY) ×3 IMPLANT
STENT POLARIS 5FRX22 (STENTS) ×3 IMPLANT
SYRINGE 10CC LL (SYRINGE) ×3 IMPLANT
TUBE CONNECTING 12'X1/4 (SUCTIONS) ×1
TUBE CONNECTING 12X1/4 (SUCTIONS) ×2 IMPLANT
TUBE FEEDING 8FR 16IN STR KANG (MISCELLANEOUS) ×3 IMPLANT

## 2016-02-04 NOTE — Transfer of Care (Signed)
Immediate Anesthesia Transfer of Care Note  Patient: Brandi Suarez  Procedure(s) Performed: Procedure(s): CYSTOSCOPY WITH RETROGRADE PYELOGRAM, URETEROSCOPY  WITH BASKETING OF STONE AND STENT PLACEMENT (Right)  Patient Location: PACU  Anesthesia Type:General  Level of Consciousness: awake, alert , oriented and patient cooperative  Airway & Oxygen Therapy: Patient Spontanous Breathing and Patient connected to nasal cannula oxygen  Post-op Assessment: Report given to RN and Post -op Vital signs reviewed and stable  Post vital signs: Reviewed and stable  Last Vitals:  Filed Vitals:   02/04/16 0835  BP: 107/73  Pulse: 62  Temp: 36.6 C  Resp: 14    Complications: No apparent anesthesia complications

## 2016-02-04 NOTE — Brief Op Note (Signed)
02/04/2016  10:28 AM  PATIENT:  Brandi Suarez  35 y.o. female  PRE-OPERATIVE DIAGNOSIS:  RIGHT URETERAL MASS  POST-OPERATIVE DIAGNOSIS:  RIGHT URETERAL OBSTRUCTION (partial), RT RENAL STONE  PROCEDURE:   Cysto, Bilateraal Retrograde pyelograms, Rt ureteroscopy with basekting of stone, Rt ureteral stent placement (5x22 polaris with tether)  SURGEON:  Surgeon(s) and Role:    * Sebastian Acheheodore Shalisha Clausing, MD - Primary  PHYSICIAN ASSISTANT:   ASSISTANTS: none   ANESTHESIA:   general  EBL:     BLOOD ADMINISTERED:none  DRAINS: none   LOCAL MEDICATIONS USED:  NONE  SPECIMEN:  Source of Specimen:  small Rt renal stone   DISPOSITION OF SPECIMEN:  [facility] for compositional analysis  COUNTS:  YES  TOURNIQUET:  * No tourniquets in log *  DICTATION: .Other Dictation: Dictation Number [id number]  PLAN OF CARE: Discharge to home after PACU  PATIENT DISPOSITION:  PACU - hemodynamically stable.   Delay start of Pharmacological VTE agent (>24hrs) due to surgical blood loss or risk of bleeding: yes

## 2016-02-04 NOTE — H&P (Signed)
Brandi Suarez is an 35 y.o. female.    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 CT Urogram 01/2015 with ? intraluminal UPJ filling defect (not crossing vessel / extrinsic compression by radiolgist and my review). Renogram 03/2015 without significant obstruction or functional asymmetry with: Right kidney 43%, left kidney 57%. [place] right kidney T1/2 6-1/2 minutes, left kidney T1/2 2.5 minutes.    PMH unremarkable. Healthy children 2 and 5.  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 Her husband Madelaine Bhatdam is an anesthesiologist.   Today "Brandi Suarez" is seen to proceed with diagnostic RIGHT ureteroscopy with goal of ruling out stricture or neoplasm. NO interval fevers. Most recent UA without infectious parameters.    Past Medical History  Diagnosis Date  . Ureteral mass     RIGHT  . Acne     Past Surgical History  Procedure Laterality Date  . Wisdom tooth extraction  teen    History reviewed. No pertinent family history. Social History:  reports that she has never smoked. She has never used smokeless tobacco. She reports that she drinks alcohol. She reports that she does not use illicit drugs.  Allergies: No Known Allergies  No prescriptions prior to admission    No results found for this or any previous visit (from the past 48 hour(s)). No results found.  Review of Systems  Constitutional: Negative.  Negative for fever.  HENT: Negative.   Eyes: Negative.   Respiratory: Negative.   Cardiovascular: Negative.   Gastrointestinal: Negative.   Genitourinary: Positive for flank pain.  Musculoskeletal: Negative.   Skin: Negative.   Neurological: Negative.   Endo/Heme/Allergies: Negative.   Psychiatric/Behavioral: Negative.     Height 5\' 3"  (1.6 m), weight 50.803 kg (112 lb), last menstrual period 01/26/2016, not currently breastfeeding. Physical Exam  Constitutional: She  appears well-developed.  HENT:  Head: Normocephalic.  Eyes: Pupils are equal, round, and reactive to light.  Neck: Normal range of motion.  Cardiovascular: Normal rate.   Respiratory: Effort normal.  GI: Soft.  Genitourinary:  NO CVAT at present  Musculoskeletal: Normal range of motion.  Neurological: She is alert.  Skin: Skin is warm.  Psychiatric: She has a normal mood and affect. Her behavior is normal. Thought content normal.     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 Although primary upper tract malignancy would be quite unusual at this age and lack or risk factors, I firmly feel this needs to be ruled out. DDX including TCC, fibroepithelial polyp, artifact (ureteral folding) discussed.   Proceed as planned today with cysto, bilat retrogrades, Rt ureteroscopy with possible biopsy.  Risks, benefits, alternatives discussed previously and reiterated today.    Sebastian AcheMANNY, Ace Bergfeld, MD 02/04/2016, 6:22 AM

## 2016-02-04 NOTE — Op Note (Signed)
NAMEREILY, TRELOAR             ACCOUNT NO.:  [id number]  MEDICAL RECORD NO.:  [id number]  LOCATION:                                 FACILITY:  PHYSICIAN:  Sebastian Ache, MD     DATE OF BIRTH:  11-27-1980  DATE OF PROCEDURE: 02/04/2016                               OPERATIVE REPORT  DIAGNOSIS:  Intermittent right flank pain, hydronephrosis, questionable ureteropelvic junction anomaly.  PROCEDURE: 1. Cystoscopy with bilateral retrograde pyelogram interpretation. 2. Right ureteroscopy with basketing of stone. 3. Insertion of right ureteral stent, 5 x 22 Polaris with tether.  ESTIMATED BLOOD LOSS:  Nil.  COMPLICATIONS:  None.  SPECIMEN:  Tiny right renal stone for compositional analysis.  FINDINGS: 1. Mild hydronephrosis without ureteral nephrosis and apparent filling     defect versus adynamic segment at the UPJ by retrograde pyelogram. 2. Unremarkable left retrograde pyelogram. 3. Likely short segment partial UPJ obstruction due to adynamic     segment with ureteral redundancy on the right, photodocumentation     performed. 4. Very small papillary tip calcification, right mid-pole.     Photodocumentation performed.  Stone completely removed. 5. Successful placement of right ureteral stent, proximal in renal     pelvis, distal in urinary bladder.  INDICATION:  Ms. Twiggs is a pleasant 35 year old young lady with history of intermittent flank pain times several years, bothered, which has been slowly increasing over time.  She underwent a previous evaluation last year with renogram and axial imaging, at which time, the renogram was relatively unremarkable for high-grade obstruction and there was a questionable proximal ureteral filling defect.  She re- presented for repeat evaluation, other symptoms have persisted and progressed somewhat.  Repeat axial imaging was obtained which again corroborated a questionable UPJ abnormality mass versus redundancy versus compression.   Options were discussed including surveillance versus pyeloplasty versus ureteroscopy with diagnostic intent and she wished to proceed with the latter.  Informed consent was obtained and placed in medical record.  PROCEDURE IN DETAIL:  The patient being Louanne Calvillo verified. Procedure being right diagnostic ureteroscopy.  Retrograde pyelography was confirmed.  Procedure was carried out.  Time-out was performed. Intravenous antibiotics were administered.  General anesthesia introduced.  Patient was placed into a low lithotomy position.  Sterile field was created by prepping and draping the patient's vagina, introitus, and proximal thighs using iodine x3.  Next, cystourethroscopy was performed using a 21-French rigid cystoscope with 30-degree offset lens.  Inspection of urinary bladder revealed no diverticula, calcifications, papular lesions.  Ureteral orifices appeared [place]. The left ureteral orifice was cannulated with 6-French end-hole catheter and left ventriculogram was obtained.  Left retrograde pyelogram demonstrated single left ureter with single system left kidney.  No filling defects or narrowing noted.  Similarly, right retrograde pyelogram was obtained.  Right retrograde pyelogram demonstrated a single right ureter with single system right kidney.  There was mild hydronephrosis with what appeared to be adynamic filling defect in the proximal ureter consistent with likely area of UPJ obstruction.  A 0.038 Zip wire was advanced to the level of the upper pole, set aside as safety wire.  An 8-French feeding tube placed in urinary bladder for pressure release.  Next, semi- rigid ureteroscopy performed the entire length of the right ureter alongside a separate Sensor working wire.  At the level of the UPJ, there was indeed an area of likely adynamic redundant ureter.  There were no intraluminal lesions, this was felt to likely represent the area of partial obstruction.   The area was observed without flow and was not felt to be pulsatile, thus diagnosis of adynamic segment and redundancy. This area was able to be navigated past the level of the renal pelvis which did appear again mildly dilated.  Next, a semi-rigid ureteroscope was exchanged for the single channel flexible digital ureteroscope which was placed under continuous fluoroscopic guidance to level of renal pelvis and systematic inspection was performed of all calices x2. Again, there was mild dilation noted.  There was a single punctate papillary tip calcification in the mid pole calyx, this did appear amenable to simple basketing as such, a basket was used to grasp the stone on its long axis and it was removed in its entirety, set aside for compositional analysis.  Repeat ureteroscopy entire length of the right ureter revealed no mucosal abnormalities.  No evidence of perforation. Given the use of ureteroscopy, it was felt that interval stenting would be warranted and also as a trial of obstruction free physiology to see if her pain actually improved with the stent in place.  As such, a new 5 x 22 Polaris-type stent was placed over the remaining safety wire using fluoroscopic guidance.  Good proximal and distal deployment were noted. Bladder was emptied per cystoscope.  Procedure was then terminated.  The patient tolerated procedure well.  There were no immediate periprocedural complications.  The patient was taken to postanesthesia care unit in stable condition.          ______________________________ Sebastian Acheheodore Abagale Boulos, MD     TM/MEDQ  D:  02/04/2016  T:  02/04/2016  Job:  [id number]

## 2016-02-04 NOTE — Anesthesia Procedure Notes (Signed)
Procedure Name: LMA Insertion Date/Time: 02/04/2016 9:58 AM Performed by: Tyrone NineSAUVE, Shafer Swamy F Pre-anesthesia Checklist: Patient identified, Timeout performed, Emergency Drugs available, Suction available and Patient being monitored Patient Re-evaluated:Patient Re-evaluated prior to inductionOxygen Delivery Method: Circle system utilized Preoxygenation: Pre-oxygenation with 100% oxygen Intubation Type: IV induction Ventilation: Mask ventilation without difficulty LMA: LMA inserted LMA Size: 4.0 Number of attempts: 1 Placement Confirmation: positive ETCO2 and breath sounds checked- equal and bilateral Tube secured with: Tape Dental Injury: Teeth and Oropharynx as per pre-operative assessment

## 2016-02-04 NOTE — Anesthesia Preprocedure Evaluation (Addendum)
Anesthesia Evaluation  Patient identified by MRN, date of birth, ID band Patient awake    Reviewed: Allergy & Precautions, H&P , Patient's Chart, lab work & pertinent test results, reviewed documented beta blocker date and time   Airway Mallampati: II  TM Distance: >3 FB Neck ROM: full    Dental no notable dental hx. (+) Teeth Intact, Dental Advisory Given   Pulmonary neg pulmonary ROS,    Pulmonary exam normal breath sounds clear to auscultation       Cardiovascular negative cardio ROS   Rhythm:regular Rate:Normal     Neuro/Psych negative neurological ROS  negative psych ROS   GI/Hepatic negative GI ROS, Neg liver ROS,   Endo/Other  negative endocrine ROS  Renal/GU negative Renal ROS     Musculoskeletal negative musculoskeletal ROS (+)   Abdominal   Peds negative pediatric ROS (+)  Hematology negative hematology ROS (+)   Anesthesia Other Findings   Reproductive/Obstetrics negative OB ROS                           Anesthesia Physical Anesthesia Plan  ASA: II  Anesthesia Plan:    Post-op Pain Management:    Induction: Intravenous  Airway Management Planned: LMA  Additional Equipment:   Intra-op Plan:   Post-operative Plan:   Informed Consent: I have reviewed the patients History and Physical, chart, labs and discussed the procedure including the risks, benefits and alternatives for the proposed anesthesia with the patient or authorized representative who has indicated his/her understanding and acceptance.   Dental Advisory Given and Dental advisory given  Plan Discussed with: CRNA, Surgeon and Anesthesiologist  Anesthesia Plan Comments: (Discussed GA with LMA, possible sore throat, potential need to switch to ETT, N/V, pulmonary aspiration. Questions answered. )       Anesthesia Quick Evaluation

## 2016-02-04 NOTE — Discharge Instructions (Signed)
1 - You may have urinary urgency (bladder spasms) and bloody urine on / off with stent in place. This is normal.  2 - Call MD or go to ER for fever >102, severe pain / nausea / vomiting not relieved by medications, or acute change in medical status  3 - Remove tethered stent on Monday morning at home by pulling on string, the blue-white plastic tubing, and discarding. Dr. Berneice HeinrichManny is in the office Monday if any issues arise.  [facility] [phone] Post Ureteroscopy With or Without Stent Instructions  Definitions:  Ureter: The duct that transports urine from the kidney to the bladder. Stent:   A plastic hollow tube that is placed into the ureter, from the kidney to the                 bladder to prevent the ureter from swelling shut.  GENERAL INSTRUCTIONS:  Despite the fact that no skin incisions were used, the area around the ureter and bladder is raw and irritated. The stent is a foreign body which will further irritate the bladder wall. This irritation is manifested by increased frequency of urination, both day and night, and by an increase in the urge to urinate. In some, the urge to urinate is present almost always. Sometimes the urge is strong enough that you may not be able to stop yourself from urinating. The only real cure is to remove the stent and then give time for the bladder wall to heal which can't be done until the danger of the ureter swelling shut has passed, which varies.  You may see some blood in your urine while the stent is in place and a few days afterwards. Do not be alarmed, even if the urine was clear for a while. Get off your feet and drink lots of fluids until clearing occurs. If you start to pass clots or don't improve, call us.  DIET: You may return to your normal diet immediately. Because of the raw surface of your bladder, alcohol, spicy foods, acid type foods and drinks with caffeine may cause irritation or frequency and should be used in  moderation. To keep your urine flowing freely and to avoid constipation, drink plenty of fluids during the day ( 8-10 glasses ). Tip: Avoid cranberry juice because it is very acidic.  ACTIVITY: Your physical activity doesn't need to be restricted. However, if you are very active, you may see some blood in your urine. We suggest that you reduce your activity under these circumstances until the bleeding has stopped.  BOWELS: It is important to keep your bowels regular during the postoperative period. Straining with bowel movements can cause bleeding. A bowel movement every other day is reasonable. Use a mild laxative if needed, such as Milk of Magnesia 2-3 tablespoons, or 2 Dulcolax tablets. Call if you continue to have problems. If you have been taking narcotics for pain, before, during or after your surgery, you may be constipated. Take a laxative if necessary.   MEDICATION: You should resume your pre-surgery medications unless told not to. In addition you will often be given an antibiotic to prevent infection. These should be taken as prescribed until the bottles are finished unless you are having an unusual reaction to one of the drugs.  PROBLEMS YOU SHOULD REPORT TO US:  Fevers over 100.5 Fahrenheit.  Heavy bleeding, or clots ( See above notes about blood in urine ).  Inability to urinate.  Drug reactions ( hives, rash, nausea, vomiting, diarrhea ).  Severe burning or pain with urination that is not improving.  FOLLOW-UP: You will need a follow-up appointment to monitor your progress. Call for this appointment at the number listed above. Usually the first appointment will be about three to fourteen days after your surgery.  Post Anesthesia Home Care Instructions  Activity: Get plenty of rest for the remainder of the day. A responsible adult should stay with you for 24 hours following the procedure.  For the next 24 hours, DO NOT: -Drive a car -Advertising copywriter -Drink alcoholic  beverages -Take any medication unless instructed by your physician -Make any legal decisions or sign important papers.  Meals: Start with liquid foods such as gelatin or soup. Progress to regular foods as tolerated. Avoid greasy, spicy, heavy foods. If nausea and/or vomiting occur, drink only clear liquids until the nausea and/or vomiting subsides. Call your physician if vomiting continues.  Special Instructions/Symptoms: Your throat may feel dry or sore from the anesthesia or the breathing tube placed in your throat during surgery. If this causes discomfort, gargle with warm salt water. The discomfort should disappear within 24 hours.  If you had a scopolamine patch placed behind your ear for the management of post- operative nausea and/or vomiting:  1. The medication in the patch is effective for 72 hours, after which it should be removed.  Wrap patch in a tissue and discard in the trash. Wash hands thoroughly with soap and water. 2. You may remove the patch earlier than 72 hours if you experience unpleasant side effects which may include dry mouth, dizziness or visual disturbances. 3. Avoid touching the patch. Wash your hands with soap and water after contact with the patch.

## 2016-02-04 NOTE — Anesthesia Postprocedure Evaluation (Signed)
Anesthesia Post Note  Patient: Brandi Suarez  Procedure(s) Performed: Procedure(s) (LRB): CYSTOSCOPY WITH RETROGRADE PYELOGRAM, URETEROSCOPY  WITH STONE BASKET EXTRACTION  AND STENT PLACEMENT (Right)  Patient location during evaluation: PACU Anesthesia Type: General Level of consciousness: sedated Pain management: satisfactory to patient Vital Signs Assessment: post-procedure vital signs reviewed and stable Respiratory status: spontaneous breathing Cardiovascular status: stable Anesthetic complications: no    Last Vitals:  Filed Vitals:   02/04/16 1115 02/04/16 1312  BP: 108/72 84/58  Pulse: 51 44  Temp:  36.5 C  Resp: 11 16    Last Pain:  Filed Vitals:   02/04/16 1312  PainSc: 7                  Nesta Scaturro EDWARD

## 2016-02-07 ENCOUNTER — Encounter (HOSPITAL_BASED_OUTPATIENT_CLINIC_OR_DEPARTMENT_OTHER): Payer: Self-pay | Admitting: Urology

## 2017-01-22 ENCOUNTER — Other Ambulatory Visit: Payer: Self-pay | Admitting: Urology

## 2017-01-22 DIAGNOSIS — Q6211 Congenital occlusion of ureteropelvic junction: Principal | ICD-10-CM

## 2017-01-22 DIAGNOSIS — Q6239 Other obstructive defects of renal pelvis and ureter: Secondary | ICD-10-CM

## 2017-02-27 ENCOUNTER — Encounter (HOSPITAL_COMMUNITY)
Admission: RE | Admit: 2017-02-27 | Discharge: 2017-02-27 | Disposition: A | Discharge status: Home / Self Care | Payer: 59 | Source: Ambulatory Visit | Attending: Urology | Admitting: Urology

## 2017-02-27 DIAGNOSIS — Q6211 Congenital occlusion of ureteropelvic junction: Secondary | ICD-10-CM | POA: Insufficient documentation

## 2017-02-27 DIAGNOSIS — Q6239 Other obstructive defects of renal pelvis and ureter: Secondary | ICD-10-CM

## 2017-02-27 MED ORDER — FUROSEMIDE 10 MG/ML IJ SOLN
26.5000 mg | Freq: Once | INTRAMUSCULAR | Status: AC
  Administered 2017-02-27: 27 mg via INTRAVENOUS

## 2017-02-27 MED ORDER — FUROSEMIDE 10 MG/ML IJ SOLN
INTRAMUSCULAR | Status: AC
  Filled 2017-02-27: qty 4

## 2017-02-27 MED ORDER — TECHNETIUM TC 99M MERTIATIDE
5.2000 | Freq: Once | INTRAVENOUS | Status: AC | PRN
  Administered 2017-02-27: 5.2 via INTRAVENOUS

## 2018-01-17 ENCOUNTER — Ambulatory Visit: Payer: Self-pay | Admitting: Emergency Medicine

## 2018-01-17 VITALS — BP 95/60 | HR 83 | Temp 98.9°F | Resp 16 | Wt 116.8 lb

## 2018-01-17 DIAGNOSIS — J029 Acute pharyngitis, unspecified: Secondary | ICD-10-CM

## 2018-01-17 DIAGNOSIS — J020 Streptococcal pharyngitis: Secondary | ICD-10-CM

## 2018-01-17 LAB — POCT INFLUENZA A/B
Influenza A, POC: NEGATIVE
Influenza B, POC: NEGATIVE

## 2018-01-17 LAB — POCT RAPID STREP A (OFFICE)
RAPID STREP A SCREEN: NEGATIVE
Rapid Strep A Screen: POSITIVE — AB

## 2018-01-17 NOTE — Progress Notes (Signed)
Subjective:     Brandi Suarez is a 37 y.o. female who presents for evaluation of influenza like symptoms. Symptoms include chills, headache, myalgias, pain while swallowing, productive cough, sore throat, swollen glands and fever and have been present for 1 day. She has tried to alleviate the symptoms with none tried with no relief. High risk factors for influenza complications: none.     Review of Systems Pertinent items noted in HPI and remainder of comprehensive ROS otherwise negative.     Objective:    BP 95/60 (BP Location: Right Arm, Patient Position: Sitting, Cuff Size: Normal)   Pulse 83   Temp 98.9 F (37.2 C) (Oral)   Resp 16   Wt 116 lb 12.8 oz (53 kg)   SpO2 98%   BMI 20.69 kg/m  General appearance: alert, cooperative and appears stated age Head: Normocephalic, without obvious abnormality, atraumatic Ears: normal TM's and external ear canals both ears Nose: Nares normal. Septum midline. Mucosa normal. No drainage or sinus tenderness. Throat: lips, mucosa, and tongue normal; teeth and gums normal and Tonsils +2 with erythema, no exudate noted Neck: moderate anterior cervical adenopathy Lungs: clear to auscultation bilaterally Heart: regular rate and rhythm Pulses: 2+ and symmetric    Assessment:    Strep Pharyngitis Plan:   RSS (+), initial read entered negative by error. Amoxicillin and magic mouthwash, tylenol or ibuprofen prn, return as needed

## 2018-01-21 ENCOUNTER — Telehealth: Payer: Self-pay

## 2019-09-18 ENCOUNTER — Other Ambulatory Visit: Payer: Self-pay | Admitting: Obstetrics and Gynecology

## 2019-09-18 DIAGNOSIS — R928 Other abnormal and inconclusive findings on diagnostic imaging of breast: Secondary | ICD-10-CM

## 2019-09-22 ENCOUNTER — Other Ambulatory Visit: Payer: Self-pay | Admitting: Obstetrics and Gynecology

## 2019-09-22 ENCOUNTER — Other Ambulatory Visit: Payer: Self-pay

## 2019-09-22 ENCOUNTER — Ambulatory Visit
Admission: RE | Admit: 2019-09-22 | Discharge: 2019-09-22 | Disposition: A | Discharge status: Home / Self Care | Payer: BC Managed Care – PPO | Source: Ambulatory Visit | Attending: Obstetrics and Gynecology | Admitting: Obstetrics and Gynecology

## 2019-09-22 DIAGNOSIS — R928 Other abnormal and inconclusive findings on diagnostic imaging of breast: Secondary | ICD-10-CM

## 2019-09-22 DIAGNOSIS — R921 Mammographic calcification found on diagnostic imaging of breast: Secondary | ICD-10-CM

## 2019-09-24 ENCOUNTER — Ambulatory Visit
Admission: RE | Admit: 2019-09-24 | Discharge: 2019-09-24 | Disposition: A | Discharge status: Home / Self Care | Payer: BC Managed Care – PPO | Source: Ambulatory Visit | Attending: Obstetrics and Gynecology | Admitting: Obstetrics and Gynecology

## 2019-09-24 ENCOUNTER — Other Ambulatory Visit: Payer: Self-pay

## 2019-09-24 DIAGNOSIS — R921 Mammographic calcification found on diagnostic imaging of breast: Secondary | ICD-10-CM

## 2019-10-22 ENCOUNTER — Other Ambulatory Visit: Payer: Self-pay | Admitting: Obstetrics and Gynecology

## 2019-10-22 ENCOUNTER — Ambulatory Visit
Admission: RE | Admit: 2019-10-22 | Discharge: 2019-10-22 | Disposition: A | Discharge status: Home / Self Care | Payer: BC Managed Care – PPO | Source: Ambulatory Visit | Attending: Obstetrics and Gynecology | Admitting: Obstetrics and Gynecology

## 2019-10-22 ENCOUNTER — Other Ambulatory Visit: Payer: Self-pay

## 2019-10-22 DIAGNOSIS — N6452 Nipple discharge: Secondary | ICD-10-CM

## 2019-10-22 DIAGNOSIS — N644 Mastodynia: Secondary | ICD-10-CM

## 2019-11-07 HISTORY — PX: BREAST BIOPSY: SHX20

## 2019-11-10 ENCOUNTER — Telehealth: Payer: Self-pay | Admitting: Diagnostic Radiology

## 2019-11-10 NOTE — Progress Notes (Signed)
Want to fu with patient to see how she is feeling.

## 2019-12-16 NOTE — Telephone Encounter (Signed)
Error

## 2020-01-09 ENCOUNTER — Ambulatory Visit: Payer: BC Managed Care – PPO | Attending: Internal Medicine

## 2020-01-09 DIAGNOSIS — Z23 Encounter for immunization: Secondary | ICD-10-CM | POA: Insufficient documentation

## 2020-01-09 NOTE — Progress Notes (Signed)
   Covid-19 Vaccination Clinic  Name:  Rosmarie Esquibel    MRN: [id number] DOB: 01/29/1981  01/09/2020  Ms. Boehler was observed post Covid-19 immunization for 15 minutes without incident. She was provided with Vaccine Information Sheet and instruction to access the V-Safe system.   Ms. Caven was instructed to call 911 with any severe reactions post vaccine: Marland Kitchen Difficulty breathing  . Swelling of face and throat  . A fast heartbeat  . A bad rash all over body  . Dizziness and weakness

## 2020-02-10 ENCOUNTER — Ambulatory Visit: Payer: BC Managed Care – PPO | Attending: Internal Medicine

## 2020-02-10 DIAGNOSIS — Z23 Encounter for immunization: Secondary | ICD-10-CM

## 2020-02-10 NOTE — Progress Notes (Signed)
   Covid-19 Vaccination Clinic  Name:  Brandi Suarez    MRN: [id number] DOB: 25-Jul-1981  02/10/2020  Brandi Suarez was observed post Covid-19 immunization for 15 minutes without incident. She was provided with Vaccine Information Sheet and instruction to access the V-Safe system.   Brandi Suarez was instructed to call 911 with any severe reactions post vaccine: Marland Kitchen Difficulty breathing  . Swelling of face and throat  . A fast heartbeat  . A bad rash all over body  . Dizziness and weakness   Immunizations Administered    Name Date Dose VIS Date Route   Pfizer COVID-19 Vaccine 02/10/2020  9:16 AM 0.3 mL 10/17/2019 Intramuscular   Manufacturer: [employer], [employer]   Lot: [id number]   NDC: [address]-6

## 2020-07-16 IMAGING — MG MM BREAST BX W/ LOC DEV 1ST LESION IMAGE BX SPEC STEREO GUIDE*R*
8 of 17 series · 8 of 40 positions shown · non-contrast
Comparison: Previous exams.
COMPARISON: Previous exams.

Addendum:
CLINICAL DATA: 8 mm group of indeterminate calcifications in the

EXAM:
RIGHT BREAST STEREOTACTIC CORE NEEDLE BIOPSY

[R (1 of 8)]
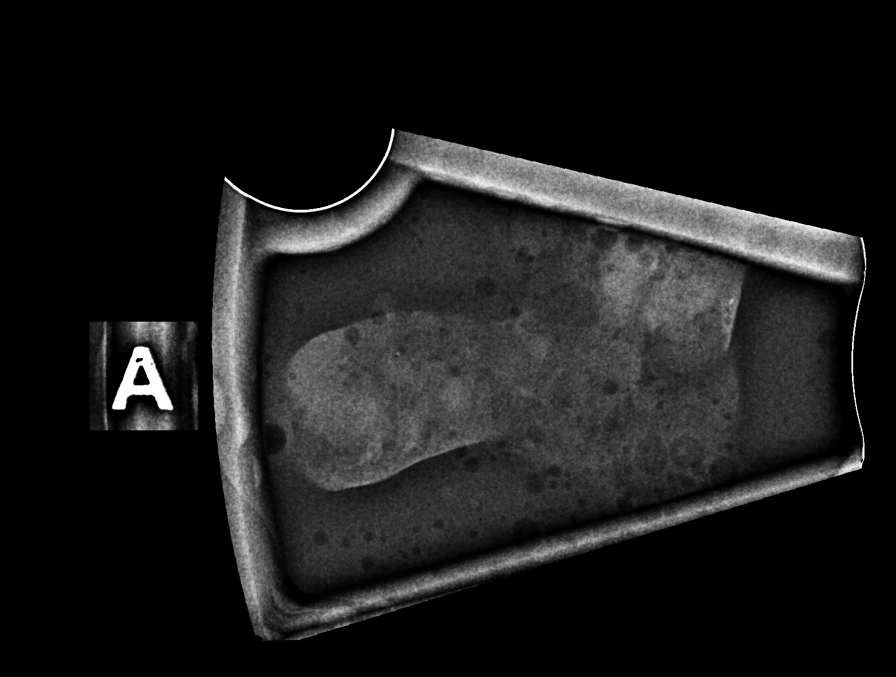

[R (2 of 8)]
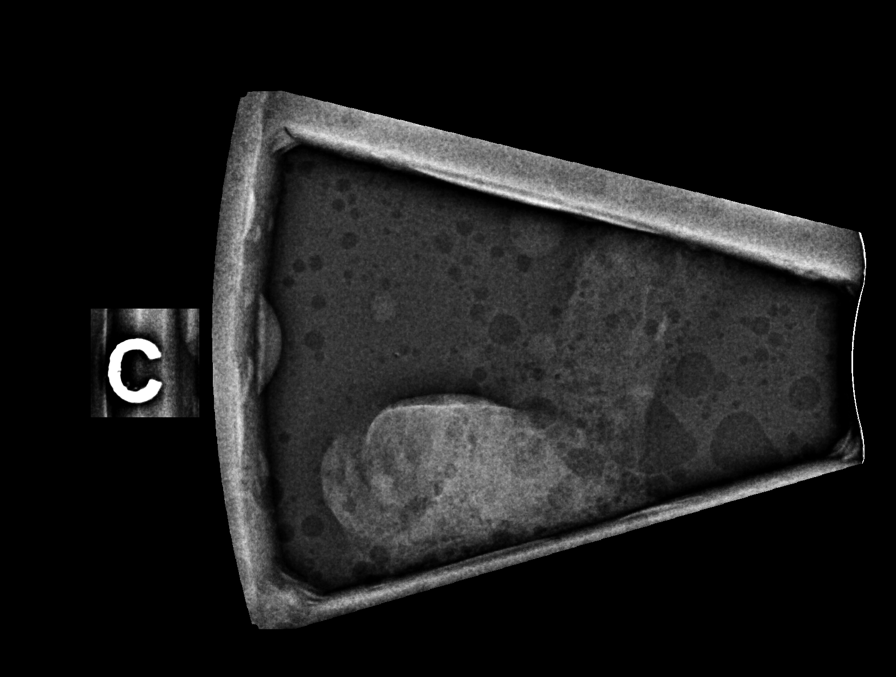

[R (3 of 8)]
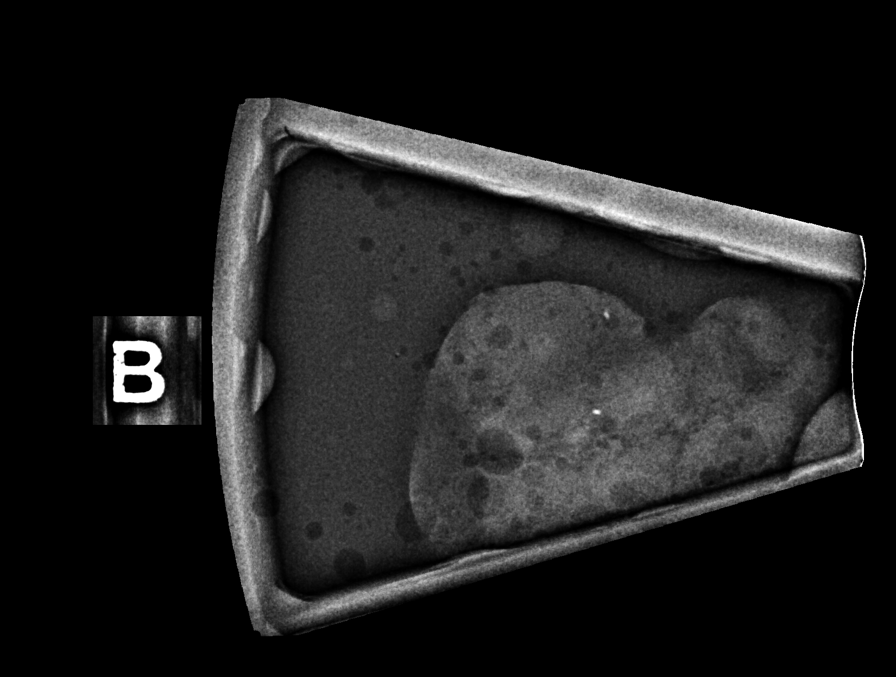

[R (4 of 8)]
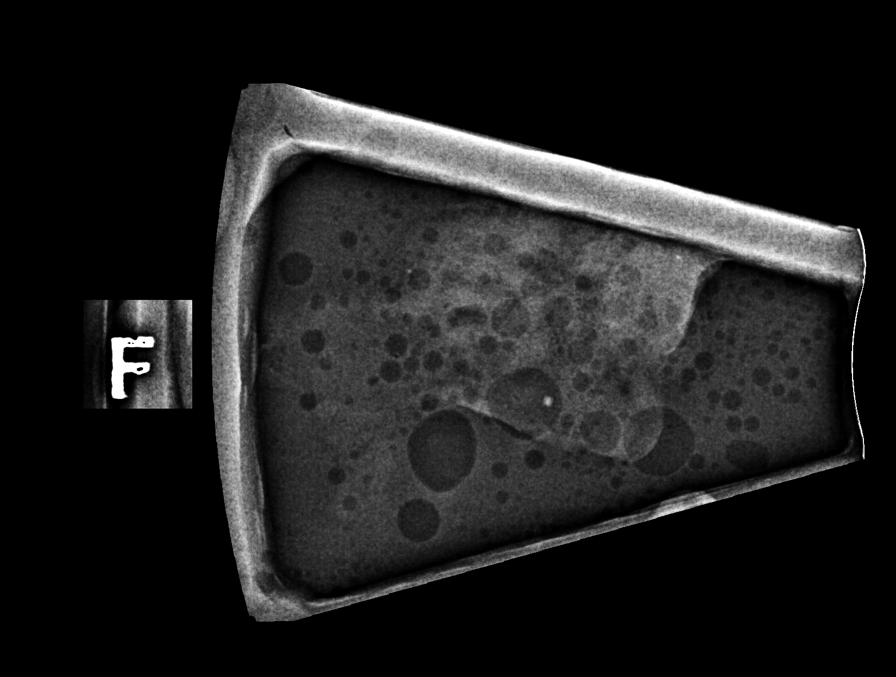

[R (5 of 8)]
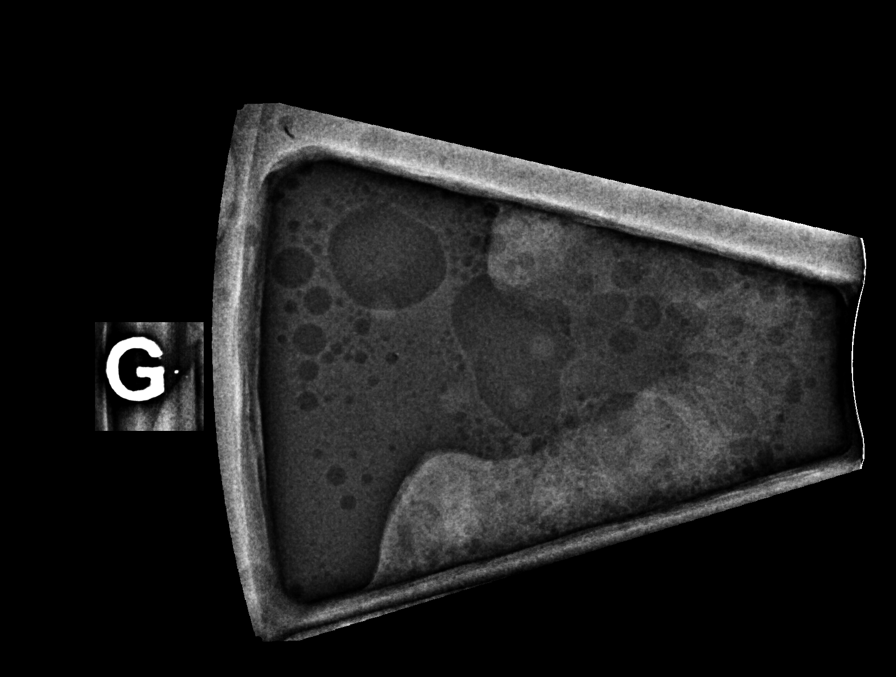

[R (6 of 8)]
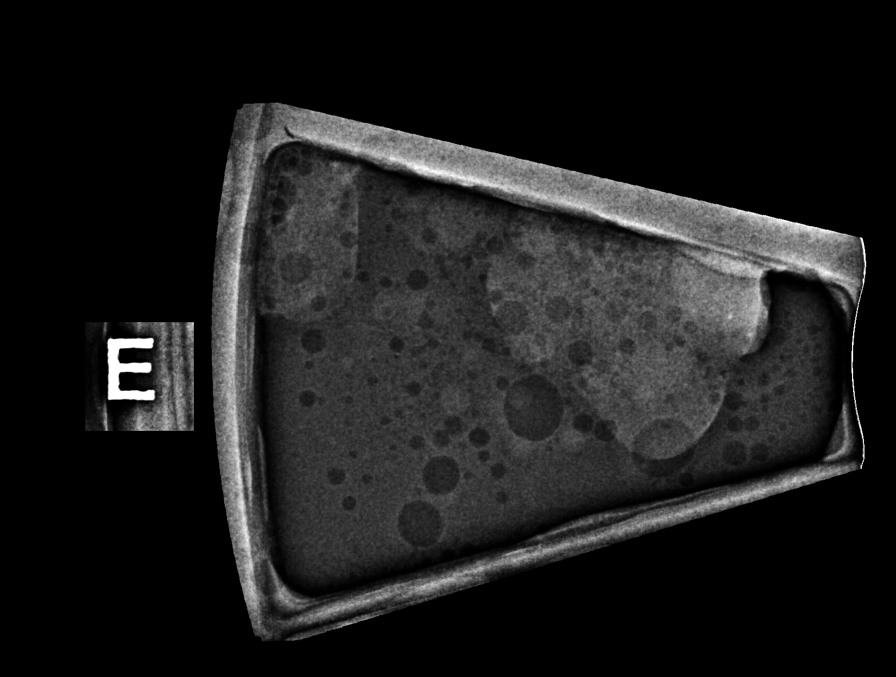

[R (7 of 8)]
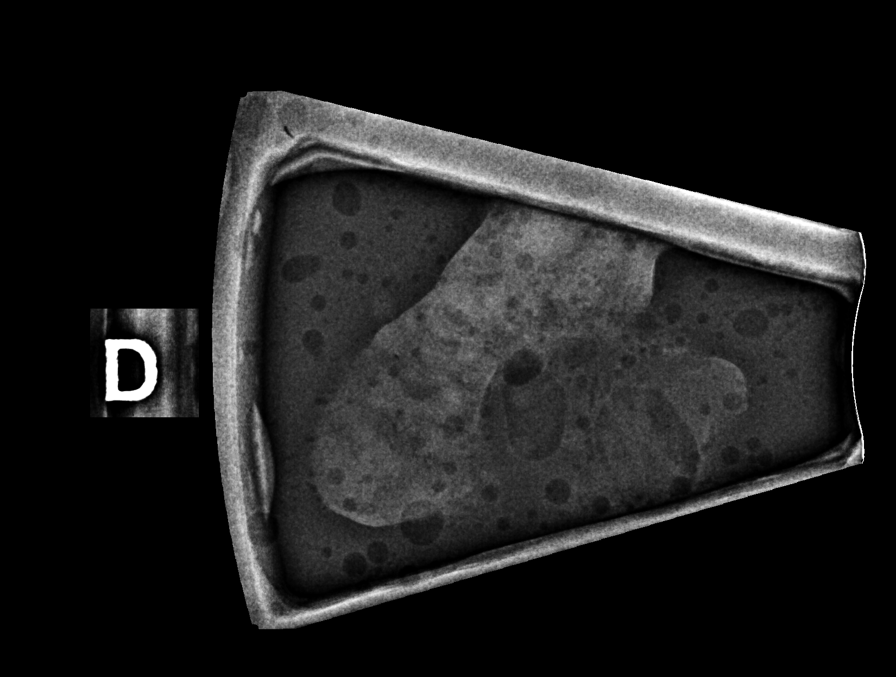

[R (8 of 8)]
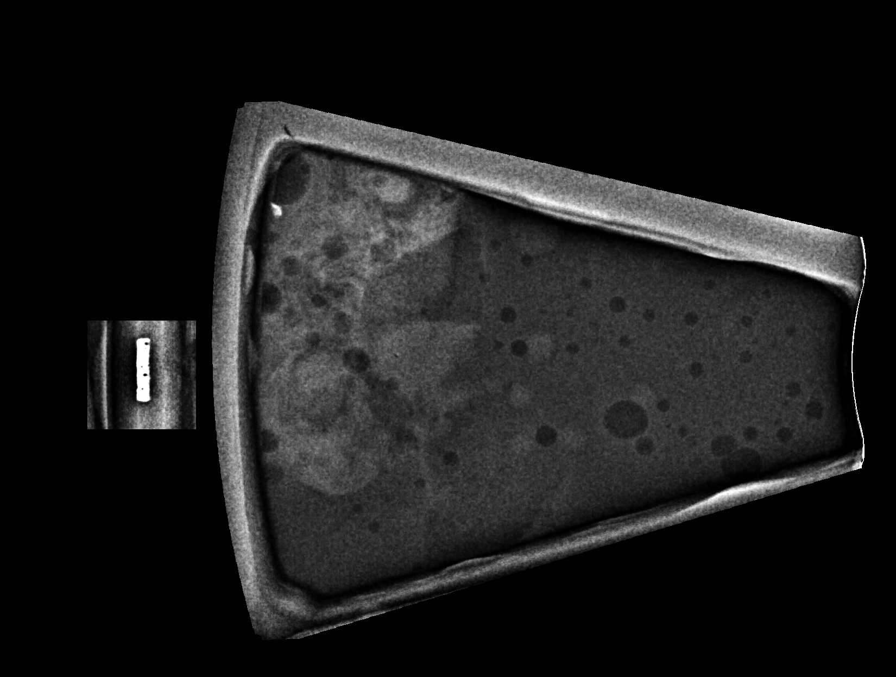

[8 of 40 positions shown; findings below may reference images not displayed]



Using sterile technique and 1% Lidocaine as local anesthetic, under
stereotactic guidance, a 9 gauge vacuum assisted device was used to
perform core needle biopsy of the recently demonstrated 8 mm group
calcifications in the outer right breast using a cephalad approach.
Specimen radiograph was performed showing multiple calcifications in
multiple specimens. Specimens with calcifications are identified for
pathology.

At the conclusion of the procedure, X shaped tissue marker clip was
deployed into the biopsy cavity. Follow-up 2-view mammogram was
performed and dictated separately.
IMPRESSION: Stereotactic-guided biopsy of the recently demonstrated 8 mm
calcifications in the central right breast, slightly laterally. No
apparent complications.

ADDENDUM:
Pathology revealed FIBROCYSTIC CHANGES WITH CALCIFICATIONS,
PSEUDOANGIOMATOUS STROMAL HYPERPLASIA (PASH) of the Right breast,
slightly outer. This was found to be concordant by Dr. Elcojin Ortega Gaitan.

Pathology results were discussed with the patient by telephone. The
patient reported doing well after the biopsy with tenderness at the
site. Post biopsy instructions and care were reviewed and questions
were answered. The patient was encouraged to call The [REDACTED]

The patient was instructed to return for annual screening

Pathology results reported by Odai Hosein, RN on 09/25/2019.



Using sterile technique and 1% Lidocaine as local anesthetic, under
stereotactic guidance, a 9 gauge vacuum assisted device was used to
perform core needle biopsy of the recently demonstrated 8 mm group
calcifications in the outer right breast using a cephalad approach.
Specimen radiograph was performed showing multiple calcifications in
multiple specimens. Specimens with calcifications are identified for
pathology.

At the conclusion of the procedure, X shaped tissue marker clip was
deployed into the biopsy cavity. Follow-up 2-view mammogram was
performed and dictated separately.
IMPRESSION: Stereotactic-guided biopsy of the recently demonstrated 8 mm
calcifications in the central right breast, slightly laterally. No
apparent complications.

## 2021-01-21 DIAGNOSIS — Z6820 Body mass index (BMI) 20.0-20.9, adult: Secondary | ICD-10-CM | POA: Diagnosis not present

## 2021-01-21 DIAGNOSIS — Z01419 Encounter for gynecological examination (general) (routine) without abnormal findings: Secondary | ICD-10-CM | POA: Diagnosis not present

## 2021-01-21 DIAGNOSIS — Z1231 Encounter for screening mammogram for malignant neoplasm of breast: Secondary | ICD-10-CM | POA: Diagnosis not present

## 2021-07-07 DIAGNOSIS — N3000 Acute cystitis without hematuria: Secondary | ICD-10-CM | POA: Diagnosis not present

## 2022-06-27 DIAGNOSIS — R5383 Other fatigue: Secondary | ICD-10-CM | POA: Diagnosis not present

## 2022-06-27 DIAGNOSIS — Z1231 Encounter for screening mammogram for malignant neoplasm of breast: Secondary | ICD-10-CM | POA: Diagnosis not present

## 2022-06-27 DIAGNOSIS — Z6822 Body mass index (BMI) 22.0-22.9, adult: Secondary | ICD-10-CM | POA: Diagnosis not present

## 2022-06-27 DIAGNOSIS — R1011 Right upper quadrant pain: Secondary | ICD-10-CM | POA: Diagnosis not present

## 2022-06-27 DIAGNOSIS — Z01419 Encounter for gynecological examination (general) (routine) without abnormal findings: Secondary | ICD-10-CM | POA: Diagnosis not present

## 2022-06-27 DIAGNOSIS — Z124 Encounter for screening for malignant neoplasm of cervix: Secondary | ICD-10-CM | POA: Diagnosis not present

## 2022-06-29 ENCOUNTER — Other Ambulatory Visit: Payer: Self-pay | Admitting: Obstetrics and Gynecology

## 2022-06-29 DIAGNOSIS — R1011 Right upper quadrant pain: Secondary | ICD-10-CM

## 2022-07-03 ENCOUNTER — Ambulatory Visit
Admission: RE | Admit: 2022-07-03 | Discharge: 2022-07-03 | Disposition: A | Discharge status: Home / Self Care | Payer: BC Managed Care – PPO | Source: Ambulatory Visit | Attending: Obstetrics and Gynecology | Admitting: Obstetrics and Gynecology

## 2022-07-03 DIAGNOSIS — R1011 Right upper quadrant pain: Secondary | ICD-10-CM

## 2022-07-03 DIAGNOSIS — K8020 Calculus of gallbladder without cholecystitis without obstruction: Secondary | ICD-10-CM | POA: Diagnosis not present

## 2022-07-03 DIAGNOSIS — R109 Unspecified abdominal pain: Secondary | ICD-10-CM | POA: Diagnosis not present

## 2022-08-18 DIAGNOSIS — K8020 Calculus of gallbladder without cholecystitis without obstruction: Secondary | ICD-10-CM | POA: Diagnosis not present

## 2022-08-24 ENCOUNTER — Other Ambulatory Visit: Payer: Self-pay | Admitting: General Surgery

## 2022-08-24 MED ORDER — SPY AGENT GREEN - (INDOCYANINE FOR INJECTION)
1.2500 mg | Freq: Once | INTRAMUSCULAR | Status: AC
  Administered 2022-08-31: 7.5 mg via INTRAVENOUS

## 2022-08-24 NOTE — Pre-Procedure Instructions (Addendum)
Surgical Instructions    Your procedure is scheduled on Thursday, October 26.  Report to [facility] Main Entrance "A" at 5:30 A.M., then check in with the Admitting office.  Call this number if you have problems the morning of surgery:  [phone]   If you have any questions prior to your surgery date call [phone]: Open Monday-Friday 8am-4pm If you experience any cold or flu symptoms such as cough, fever, chills, shortness of breath, etc. between now and your scheduled surgery, please notify us at the above number     Remember:  Do not eat after midnight the night before your surgery  You may drink clear liquids until 4:30AM the morning of your surgery.   Clear liquids allowed are: Water, Non-Citrus Juices (without pulp), Carbonated Beverages, Clear Tea, Black Coffee ONLY (NO MILK, CREAM OR POWDERED CREAMER of any kind), and Gatorade    Take these medicines the morning of surgery with A SIP OF WATER: NONE   As of today, STOP taking any Aspirin (unless otherwise instructed by your surgeon) Aleve, Naproxen, Ibuprofen, Motrin, Advil, Goody's, BC's, all herbal medications, fish oil, and all vitamins.           [facility] is not responsible for any belongings or valuables.    Do NOT Smoke (Tobacco/Vaping)  24 hours prior to your procedure  If you use a CPAP at night, you may bring your mask for your overnight stay.   Contacts, glasses, hearing aids, dentures or partials may not be worn into surgery, please bring cases for these belongings   For patients admitted to the hospital, discharge time will be determined by your treatment team.   Patients discharged the day of surgery will not be allowed to drive home, and someone needs to stay with them for 24 hours.   SURGICAL WAITING ROOM VISITATION Patients having surgery or a procedure may have no more than 2 support people in the waiting area - these visitors may rotate.   Children under the age of 28 must have an adult with  them who is not the patient. If the patient needs to stay at the hospital during part of their recovery, the visitor guidelines for inpatient rooms apply. Pre-op nurse will coordinate an appropriate time for 1 support person to accompany patient in pre-op.  This support person may not rotate.   Please refer to [email or web address] for the visitor guidelines for Inpatients (after your surgery is over and you are in a regular room).    Special instructions:    Oral Hygiene is also important to reduce your risk of infection.  Remember - BRUSH YOUR TEETH THE MORNING OF SURGERY WITH YOUR REGULAR TOOTHPASTE   [facility]- Preparing For Surgery  Before surgery, you can play an important role. Because skin is not sterile, your skin needs to be as free of germs as possible. You can reduce the number of germs on your skin by washing with CHG (chlorahexidine gluconate) Soap before surgery.  CHG is an antiseptic cleaner which kills germs and bonds with the skin to continue killing germs even after washing.     Please do not use if you have an allergy to CHG or antibacterial soaps. If your skin becomes reddened/irritated stop using the CHG.  Do not shave (including legs and underarms) for at least 48 hours prior to first CHG shower. It is OK to shave your face.  Please follow these instructions carefully.     Shower the [employer] SURGERY and  the MORNING OF SURGERY with CHG Soap.   If you chose to wash your hair, wash your hair first as usual with your normal shampoo. After you shampoo, rinse your hair and body thoroughly to remove the shampoo.  Then [employer] and genitals (private parts) with your normal soap and rinse thoroughly to remove soap.  After that Use CHG Soap as you would any other liquid soap. You can apply CHG directly to the skin and wash gently with a scrungie or a clean washcloth.   Apply the CHG Soap to your body ONLY FROM THE NECK  DOWN.  Do not use on open wounds or open sores. Avoid contact with your eyes, ears, mouth and genitals (private parts). Wash Face and genitals (private parts)  with your normal soap.   Wash thoroughly, paying special attention to the area where your surgery will be performed.  Thoroughly rinse your body with warm water from the neck down.  DO NOT shower/wash with your normal soap after using and rinsing off the CHG Soap.  Pat yourself dry with a CLEAN TOWEL.  Wear CLEAN PAJAMAS to bed the night before surgery  Place CLEAN SHEETS on your bed the night before your surgery  DO NOT SLEEP WITH PETS.   Day of Surgery:  Take a shower with CHG soap. Wear Clean/Comfortable clothing the morning of surgery Do not wear jewelry or makeup. Do not wear lotions, powders, perfumes/cologne or deodorant. Do not shave 48 hours prior to surgery.  Men may shave face and neck. Do not bring valuables to the hospital. Do not wear nail polish, gel polish, artificial nails, or any other type of covering on natural nails (fingers and toes) If you have artificial nails or gel coating that need to be removed by a nail salon, please have this removed prior to surgery. Artificial nails or gel coating may interfere with anesthesia's ability to adequately monitor your vital signs. Remember to brush your teeth WITH YOUR REGULAR TOOTHPASTE.    If you received a COVID test during your pre-op visit, it is requested that you wear a mask when out in public, stay away from anyone that may not be feeling well, and notify your surgeon if you develop symptoms. If you have been in contact with anyone that has tested positive in the last 10 days, please notify your surgeon.    Please read over the following fact sheets that you were given.

## 2022-08-25 ENCOUNTER — Other Ambulatory Visit: Payer: Self-pay

## 2022-08-25 ENCOUNTER — Encounter (HOSPITAL_COMMUNITY)
Admission: RE | Admit: 2022-08-25 | Discharge: 2022-08-25 | Disposition: A | Discharge status: Home / Self Care | Payer: BC Managed Care – PPO | Source: Ambulatory Visit | Attending: General Surgery | Admitting: General Surgery

## 2022-08-25 ENCOUNTER — Encounter (HOSPITAL_COMMUNITY): Payer: Self-pay

## 2022-08-25 VITALS — BP 110/79 | HR 68 | Temp 98.7°F | Resp 17 | Ht 63.0 in | Wt 131.9 lb

## 2022-08-25 DIAGNOSIS — Z01812 Encounter for preprocedural laboratory examination: Secondary | ICD-10-CM | POA: Insufficient documentation

## 2022-08-25 DIAGNOSIS — Z01818 Encounter for other preprocedural examination: Secondary | ICD-10-CM

## 2022-08-25 HISTORY — DX: Anemia, unspecified: D64.9

## 2022-08-25 HISTORY — DX: Headache, unspecified: R51.9

## 2022-08-25 LAB — CBC
HCT: 40.4 % (ref 36.0–46.0)
Hemoglobin: 13.2 g/dL (ref 12.0–15.0)
MCH: 29.3 pg (ref 26.0–34.0)
MCHC: 32.7 g/dL (ref 30.0–36.0)
MCV: 89.8 fL (ref 80.0–100.0)
Platelets: 209 10*3/uL (ref 150–400)
RBC: 4.5 MIL/uL (ref 3.87–5.11)
RDW: 12.7 % (ref 11.5–15.5)
WBC: 9.2 10*3/uL (ref 4.0–10.5)
nRBC: 0 % (ref 0.0–0.2)

## 2022-08-25 NOTE — Progress Notes (Signed)
PCP - no pcp Cardiologist - denies Gynecologist: Dr. Helane Rima  PPM/ICD - denies   Chest x-ray - n/a EKG - n/a Stress Test - denies ECHO - denies Cardiac Cath - denies  Sleep Study - denies    Follow your surgeon's instructions on when to stop Aspirin.  If no instructions were given by your surgeon then you will need to call the office to get those instructions.     ERAS Protcol -yes PRE-SURGERY Ensure or G2- ensure ordered and give  COVID TEST- not needed   Anesthesia review: no  Patient denies shortness of breath, fever, cough and chest pain at PAT appointment   All instructions explained to the patient, with a verbal understanding of the material. Patient agrees to go over the instructions while at home for a better understanding. Patient also instructed to self quarantine after being tested for COVID-19. The opportunity to ask questions was provided.

## 2022-08-25 NOTE — Pre-Procedure Instructions (Signed)
Surgical Instructions    Your procedure is scheduled on Thursday, October 26.  Report to [facility] Main Entrance "A" at 5:30 A.M., then check in with the Admitting office.  Call this number if you have problems the morning of surgery:  [phone]   If you have any questions prior to your surgery date call [phone]: Open Monday-Friday 8am-4pm If you experience any cold or flu symptoms such as cough, fever, chills, shortness of breath, etc. between now and your scheduled surgery, please notify us at the above number     Remember:  Do not eat after midnight the night before your surgery  You may drink clear liquids until 4:30AM the morning of your surgery.   Clear liquids allowed are: Water, Non-Citrus Juices (without pulp), Carbonated Beverages, Clear Tea, Black Coffee ONLY (NO MILK, CREAM OR POWDERED CREAMER of any kind), and Gatorade  Patient Instructions  The night before surgery:  No food after midnight. ONLY clear liquids after midnight  The day of surgery (if you do NOT have diabetes):  Drink ONE (1) Pre-Surgery Clear Ensure by 4:30am the morning of surgery. Drink in one sitting. Do not sip.  This drink was given to you during your hospital  pre-op appointment visit. Nothing else to drink after completing the  Pre-Surgery Clear Ensure.           If you have questions, please contact your surgeon's office.     Take these medicines the morning of surgery with A SIP OF WATER: NONE   As of today, STOP taking any Aspirin (unless otherwise instructed by your surgeon) Aleve, Naproxen, Ibuprofen, Motrin, Advil, Goody's, BC's, all herbal medications, fish oil, and all vitamins.           [facility] is not responsible for any belongings or valuables.    Do NOT Smoke (Tobacco/Vaping)  24 hours prior to your procedure  If you use a CPAP at night, you may bring your mask for your overnight stay.   Contacts, glasses, hearing aids, dentures or partials may not be worn  into surgery, please bring cases for these belongings   For patients admitted to the hospital, discharge time will be determined by your treatment team.   Patients discharged the day of surgery will not be allowed to drive home, and someone needs to stay with them for 24 hours.   SURGICAL WAITING ROOM VISITATION Patients having surgery or a procedure may have no more than 2 support people in the waiting area - these visitors may rotate.   Children under the age of 22 must have an adult with them who is not the patient. If the patient needs to stay at the hospital during part of their recovery, the visitor guidelines for inpatient rooms apply. Pre-op nurse will coordinate an appropriate time for 1 support person to accompany patient in pre-op.  This support person may not rotate.   Please refer to [email or web address] for the visitor guidelines for Inpatients (after your surgery is over and you are in a regular room).    Special instructions:    Oral Hygiene is also important to reduce your risk of infection.  Remember - BRUSH YOUR TEETH THE MORNING OF SURGERY WITH YOUR REGULAR TOOTHPASTE   [place]- Preparing For Surgery  Before surgery, you can play an important role. Because skin is not sterile, your skin needs to be as free of germs as possible. You can reduce the number of germs on your skin by washing with CHG (  chlorahexidine gluconate) Soap before surgery.  CHG is an antiseptic cleaner which kills germs and bonds with the skin to continue killing germs even after washing.     Please do not use if you have an allergy to CHG or antibacterial soaps. If your skin becomes reddened/irritated stop using the CHG.  Do not shave (including legs and underarms) for at least 48 hours prior to first CHG shower. It is OK to shave your face.  Please follow these instructions carefully.     Shower the NIGHT BEFORE SURGERY and the MORNING OF  SURGERY with CHG Soap.   If you chose to wash your hair, wash your hair first as usual with your normal shampoo. After you shampoo, rinse your hair and body thoroughly to remove the shampoo.  Then [employer] and genitals (private parts) with your normal soap and rinse thoroughly to remove soap.  After that Use CHG Soap as you would any other liquid soap. You can apply CHG directly to the skin and wash gently with a scrungie or a clean washcloth.   Apply the CHG Soap to your body ONLY FROM THE NECK DOWN.  Do not use on open wounds or open sores. Avoid contact with your eyes, ears, mouth and genitals (private parts). Wash Face and genitals (private parts)  with your normal soap.   Wash thoroughly, paying special attention to the area where your surgery will be performed.  Thoroughly rinse your body with warm water from the neck down.  DO NOT shower/wash with your normal soap after using and rinsing off the CHG Soap.  Pat yourself dry with a CLEAN TOWEL.  Wear CLEAN PAJAMAS to bed the night before surgery  Place CLEAN SHEETS on your bed the night before your surgery  DO NOT SLEEP WITH PETS.   Day of Surgery:  Take a shower with CHG soap. Wear Clean/Comfortable clothing the morning of surgery Do not wear jewelry or makeup. Do not wear lotions, powders, perfumes/cologne or deodorant. Do not shave 48 hours prior to surgery.   Do not bring valuables to the hospital. Do not wear nail polish, gel polish, artificial nails, or any other type of covering on natural nails (fingers and toes) If you have artificial nails or gel coating that need to be removed by a nail salon, please have this removed prior to surgery. Artificial nails or gel coating may interfere with anesthesia's ability to adequately monitor your vital signs. Remember to brush your teeth WITH YOUR REGULAR TOOTHPASTE.    If you received a COVID test during your pre-op visit, it is requested that you wear a mask when out in  public, stay away from anyone that may not be feeling well, and notify your surgeon if you develop symptoms. If you have been in contact with anyone that has tested positive in the last 10 days, please notify your surgeon.    Please read over the following fact sheets that you were given.

## 2022-08-31 ENCOUNTER — Ambulatory Visit (HOSPITAL_COMMUNITY): Payer: BC Managed Care – PPO | Admitting: Certified Registered Nurse Anesthetist

## 2022-08-31 ENCOUNTER — Other Ambulatory Visit: Payer: Self-pay

## 2022-08-31 ENCOUNTER — Ambulatory Visit (HOSPITAL_COMMUNITY)
Admission: RE | Admit: 2022-08-31 | Discharge: 2022-08-31 | Disposition: A | Discharge status: Home / Self Care | Payer: BC Managed Care – PPO | Attending: General Surgery | Admitting: General Surgery

## 2022-08-31 ENCOUNTER — Encounter (HOSPITAL_COMMUNITY): Payer: Self-pay | Admitting: General Surgery

## 2022-08-31 ENCOUNTER — Encounter (HOSPITAL_COMMUNITY)
Admission: RE | Disposition: A | Discharge status: Home / Self Care | Payer: Self-pay | Source: Home / Self Care | Attending: General Surgery

## 2022-08-31 DIAGNOSIS — Z01818 Encounter for other preprocedural examination: Secondary | ICD-10-CM

## 2022-08-31 DIAGNOSIS — K8010 Calculus of gallbladder with chronic cholecystitis without obstruction: Secondary | ICD-10-CM | POA: Insufficient documentation

## 2022-08-31 DIAGNOSIS — G8918 Other acute postprocedural pain: Secondary | ICD-10-CM | POA: Diagnosis not present

## 2022-08-31 HISTORY — PX: CHOLECYSTECTOMY: SHX55

## 2022-08-31 LAB — POCT PREGNANCY, URINE: Preg Test, Ur: NEGATIVE

## 2022-08-31 SURGERY — LAPAROSCOPIC CHOLECYSTECTOMY
Anesthesia: General | Site: Abdomen

## 2022-08-31 MED ORDER — CHLORHEXIDINE GLUCONATE CLOTH 2 % EX PADS: 6.0000 | MEDICATED_PAD | Freq: Once | CUTANEOUS | Status: DC

## 2022-08-31 MED ORDER — KETOROLAC TROMETHAMINE 15 MG/ML IJ SOLN: 15.0000 mg | INTRAMUSCULAR | Status: AC

## 2022-08-31 MED ORDER — SODIUM CHLORIDE 0.9 % IR SOLN
Status: DC | PRN
  Administered 2022-08-31: 1000 mL

## 2022-08-31 MED ORDER — ROCURONIUM BROMIDE 10 MG/ML (PF) SYRINGE
PREFILLED_SYRINGE | INTRAVENOUS | Status: DC | PRN
  Administered 2022-08-31: 60 mg via INTRAVENOUS

## 2022-08-31 MED ORDER — PROPOFOL 10 MG/ML IV BOLUS
INTRAVENOUS | Status: AC
  Filled 2022-08-31: qty 20

## 2022-08-31 MED ORDER — BUPIVACAINE-EPINEPHRINE (PF) 0.25% -1:200000 IJ SOLN
INTRAMUSCULAR | Status: DC | PRN
  Administered 2022-08-31 (×2): 30 mL

## 2022-08-31 MED ORDER — CHLORHEXIDINE GLUCONATE 0.12 % MT SOLN
OROMUCOSAL | Status: AC
  Administered 2022-08-31: 15 mL via OROMUCOSAL
  Filled 2022-08-31: qty 15

## 2022-08-31 MED ORDER — FENTANYL CITRATE (PF) 100 MCG/2ML IJ SOLN
25.0000 ug | INTRAMUSCULAR | Status: DC | PRN
  Administered 2022-08-31 (×3): 50 ug via INTRAVENOUS

## 2022-08-31 MED ORDER — MIDAZOLAM HCL 2 MG/2ML IJ SOLN
INTRAMUSCULAR | Status: AC
  Filled 2022-08-31: qty 2

## 2022-08-31 MED ORDER — ORAL CARE MOUTH RINSE: 15.0000 mL | Freq: Once | OROMUCOSAL | Status: AC

## 2022-08-31 MED ORDER — FENTANYL CITRATE (PF) 250 MCG/5ML IJ SOLN
INTRAMUSCULAR | Status: AC
  Filled 2022-08-31: qty 5

## 2022-08-31 MED ORDER — PROPOFOL 10 MG/ML IV BOLUS
INTRAVENOUS | Status: DC | PRN
  Administered 2022-08-31: 110 mg via INTRAVENOUS
  Administered 2022-08-31: 20 mg via INTRAVENOUS

## 2022-08-31 MED ORDER — ACETAMINOPHEN 325 MG PO TABS: 325.0000 mg | ORAL_TABLET | ORAL | Status: DC | PRN

## 2022-08-31 MED ORDER — PROMETHAZINE HCL 25 MG/ML IJ SOLN
6.2500 mg | INTRAMUSCULAR | Status: DC | PRN
  Administered 2022-08-31: 12.5 mg via INTRAVENOUS

## 2022-08-31 MED ORDER — LACTATED RINGERS IV SOLN: INTRAVENOUS | Status: DC

## 2022-08-31 MED ORDER — ACETAMINOPHEN 10 MG/ML IV SOLN: 1000.0000 mg | Freq: Once | INTRAVENOUS | Status: DC | PRN

## 2022-08-31 MED ORDER — LIDOCAINE 2% (20 MG/ML) 5 ML SYRINGE
INTRAMUSCULAR | Status: DC | PRN
  Administered 2022-08-31: 40 mg via INTRAVENOUS

## 2022-08-31 MED ORDER — FENTANYL CITRATE (PF) 250 MCG/5ML IJ SOLN
INTRAMUSCULAR | Status: DC | PRN
  Administered 2022-08-31 (×6): 50 ug via INTRAVENOUS

## 2022-08-31 MED ORDER — ACETAMINOPHEN 500 MG PO TABS
ORAL_TABLET | ORAL | Status: AC
  Administered 2022-08-31: 1000 mg via ORAL
  Filled 2022-08-31: qty 1

## 2022-08-31 MED ORDER — OXYCODONE HCL 5 MG/5ML PO SOLN: 5.0000 mg | Freq: Once | ORAL | Status: DC | PRN

## 2022-08-31 MED ORDER — CEFAZOLIN SODIUM-DEXTROSE 2-4 GM/100ML-% IV SOLN
2.0000 g | INTRAVENOUS | Status: AC
  Administered 2022-08-31: 2 g via INTRAVENOUS

## 2022-08-31 MED ORDER — OXYCODONE HCL 5 MG PO TABS: 5.0000 mg | ORAL_TABLET | Freq: Once | ORAL | Status: DC | PRN

## 2022-08-31 MED ORDER — 0.9 % SODIUM CHLORIDE (POUR BTL) OPTIME
TOPICAL | Status: DC | PRN
  Administered 2022-08-31: 1000 mL

## 2022-08-31 MED ORDER — ACETAMINOPHEN 160 MG/5ML PO SOLN: 325.0000 mg | ORAL | Status: DC | PRN

## 2022-08-31 MED ORDER — ENSURE PRE-SURGERY PO LIQD: 296.0000 mL | Freq: Once | ORAL | Status: DC

## 2022-08-31 MED ORDER — SCOPOLAMINE 1 MG/3DAYS TD PT72
1.0000 | MEDICATED_PATCH | TRANSDERMAL | Status: DC
  Administered 2022-08-31: 1.5 mg via TRANSDERMAL

## 2022-08-31 MED ORDER — ONDANSETRON HCL 4 MG/2ML IJ SOLN
INTRAMUSCULAR | Status: DC | PRN
  Administered 2022-08-31: 4 mg via INTRAVENOUS

## 2022-08-31 MED ORDER — ACETAMINOPHEN 500 MG PO TABS: 1000.0000 mg | ORAL_TABLET | ORAL | Status: AC

## 2022-08-31 MED ORDER — CEFAZOLIN SODIUM-DEXTROSE 2-4 GM/100ML-% IV SOLN
INTRAVENOUS | Status: AC
  Filled 2022-08-31: qty 100

## 2022-08-31 MED ORDER — TRAMADOL HCL 50 MG PO TABS: 50.0000 mg | ORAL_TABLET | Freq: Four times a day (QID) | ORAL | 0 refills | Status: AC | PRN

## 2022-08-31 MED ORDER — MIDAZOLAM HCL 2 MG/2ML IJ SOLN
INTRAMUSCULAR | Status: DC | PRN
  Administered 2022-08-31: 2 mg via INTRAVENOUS

## 2022-08-31 MED ORDER — CHLORHEXIDINE GLUCONATE 0.12 % MT SOLN: 15.0000 mL | Freq: Once | OROMUCOSAL | Status: AC

## 2022-08-31 MED ORDER — SUGAMMADEX SODIUM 200 MG/2ML IV SOLN
INTRAVENOUS | Status: DC | PRN
  Administered 2022-08-31: 200 mg via INTRAVENOUS

## 2022-08-31 MED ORDER — FENTANYL CITRATE (PF) 100 MCG/2ML IJ SOLN
INTRAMUSCULAR | Status: AC
  Filled 2022-08-31: qty 2

## 2022-08-31 MED ORDER — DEXAMETHASONE SODIUM PHOSPHATE 10 MG/ML IJ SOLN
INTRAMUSCULAR | Status: DC | PRN
  Administered 2022-08-31: 4 mg via INTRAVENOUS

## 2022-08-31 MED ORDER — PROMETHAZINE HCL 25 MG/ML IJ SOLN
INTRAMUSCULAR | Status: AC
  Filled 2022-08-31: qty 1

## 2022-08-31 MED ORDER — MEPERIDINE HCL 25 MG/ML IJ SOLN: 6.2500 mg | Freq: Once | INTRAMUSCULAR | Status: DC

## 2022-08-31 MED ORDER — PROPOFOL 500 MG/50ML IV EMUL
INTRAVENOUS | Status: DC | PRN
  Administered 2022-08-31: 100 ug/kg/min via INTRAVENOUS

## 2022-08-31 MED ORDER — AMISULPRIDE (ANTIEMETIC) 5 MG/2ML IV SOLN: 10.0000 mg | Freq: Once | INTRAVENOUS | Status: DC | PRN

## 2022-08-31 MED ORDER — BUPIVACAINE-EPINEPHRINE (PF) 0.25% -1:200000 IJ SOLN
INTRAMUSCULAR | Status: AC
  Filled 2022-08-31: qty 30

## 2022-08-31 MED ORDER — KETOROLAC TROMETHAMINE 15 MG/ML IJ SOLN
INTRAMUSCULAR | Status: AC
  Administered 2022-08-31: 15 mg via INTRAVENOUS
  Filled 2022-08-31: qty 1

## 2022-08-31 MED ORDER — BUPIVACAINE-EPINEPHRINE (PF) 0.25% -1:200000 IJ SOLN
INTRAMUSCULAR | Status: DC | PRN
  Administered 2022-08-31: 9 mL

## 2022-08-31 SURGICAL SUPPLY — 38 items
APPLIER CLIP 5 13 M/L LIGAMAX5 (MISCELLANEOUS) ×3
BAG COUNTER SPONGE SURGICOUNT (BAG) ×3 IMPLANT
BLADE CLIPPER SURG (BLADE) IMPLANT
CANISTER SUCT 3000ML PPV (MISCELLANEOUS) ×3 IMPLANT
CHLORAPREP W/TINT 26 (MISCELLANEOUS) ×3 IMPLANT
CLIP APPLIE 5 13 M/L LIGAMAX5 (MISCELLANEOUS) ×3 IMPLANT
COVER SURGICAL LIGHT HANDLE (MISCELLANEOUS) ×3 IMPLANT
DERMABOND ADVANCED .7 DNX12 (GAUZE/BANDAGES/DRESSINGS) ×3 IMPLANT
ELECT REM PT RETURN 9FT ADLT (ELECTROSURGICAL) ×3
ELECTRODE REM PT RTRN 9FT ADLT (ELECTROSURGICAL) ×3 IMPLANT
GLOVE BIO SURGEON STRL SZ7 (GLOVE) ×3 IMPLANT
GLOVE BIOGEL PI IND STRL 7.5 (GLOVE) ×3 IMPLANT
GOWN STRL REUS W/ TWL LRG LVL3 (GOWN DISPOSABLE) ×9 IMPLANT
GOWN STRL REUS W/TWL LRG LVL3 (GOWN DISPOSABLE) ×9
GRASPER SUT TROCAR 14GX15 (MISCELLANEOUS) ×3 IMPLANT
KIT BASIN OR (CUSTOM PROCEDURE TRAY) ×3 IMPLANT
KIT IMAGING PINPOINTPAQ (MISCELLANEOUS) ×1 IMPLANT
KIT TURNOVER KIT B (KITS) ×3 IMPLANT
NS IRRIG 1000ML POUR BTL (IV SOLUTION) ×3 IMPLANT
PAD ARMBOARD 7.5X6 YLW CONV (MISCELLANEOUS) ×3 IMPLANT
POUCH RETRIEVAL ECOSAC 10 (ENDOMECHANICALS) ×3 IMPLANT
POUCH RETRIEVAL ECOSAC 10MM (ENDOMECHANICALS) ×3
SCISSORS LAP 5X35 DISP (ENDOMECHANICALS) ×3 IMPLANT
SET IRRIG TUBING LAPAROSCOPIC (IRRIGATION / IRRIGATOR) ×3 IMPLANT
SET TUBE SMOKE EVAC HIGH FLOW (TUBING) ×3 IMPLANT
SLEEVE ENDOPATH XCEL 5M (ENDOMECHANICALS) ×6 IMPLANT
SLEEVE Z-THREAD 5X100MM (TROCAR) ×2 IMPLANT
SPECIMEN JAR SMALL (MISCELLANEOUS) ×3 IMPLANT
STRIP CLOSURE SKIN 1/2X4 (GAUZE/BANDAGES/DRESSINGS) ×3 IMPLANT
SUT MNCRL AB 4-0 PS2 18 (SUTURE) ×4 IMPLANT
SUT VICRYL 0 UR6 27IN ABS (SUTURE) ×3 IMPLANT
TOWEL GREEN STERILE (TOWEL DISPOSABLE) ×3 IMPLANT
TOWEL GREEN STERILE FF (TOWEL DISPOSABLE) ×3 IMPLANT
TRAY LAPAROSCOPIC MC (CUSTOM PROCEDURE TRAY) ×3 IMPLANT
TROCAR BALLN 12MMX100 BLUNT (TROCAR) ×1 IMPLANT
TROCAR XCEL BLUNT TIP 100MML (ENDOMECHANICALS) ×2 IMPLANT
TROCAR Z-THREAD OPTICAL 5X100M (TROCAR) ×3 IMPLANT
WATER STERILE IRR 1000ML POUR (IV SOLUTION) ×3 IMPLANT

## 2022-08-31 NOTE — Interval H&P Note (Signed)
History and Physical Interval Note:  08/31/2022 7:22 AM  Brandi Suarez  has presented today for surgery, with the diagnosis of GALLSTONES.  The various methods of treatment have been discussed with the patient and family. After consideration of risks, benefits and other options for treatment, the patient has consented to  Procedure(s) with comments: LAPAROSCOPIC CHOLECYSTECTOMY WITH ICG DYE (N/A) - GEN AND TAP BLOCK as a surgical intervention.  The patient's history has been reviewed, patient examined, no change in status, stable for surgery.  I have reviewed the patient's chart and labs.  Questions were answered to the patient's satisfaction.     Rolm Bookbinder

## 2022-08-31 NOTE — Discharge Instructions (Signed)
[facility] LAPAROSCOPIC SURGERY: POST OP INSTRUCTIONS  Always review your discharge instruction sheet given to you by the facility where your surgery was performed. IF YOU HAVE DISABILITY OR FAMILY LEAVE FORMS, YOU MUST BRING THEM TO THE OFFICE FOR PROCESSING.   DO NOT GIVE THEM TO YOUR DOCTOR.  A prescription for pain medication may be given to you upon discharge.  Take your pain medication as prescribed, if needed.  If narcotic pain medicine is not needed, then you may take acetaminophen (Tylenol), naprosyn (Alleve), or ibuprofen (Advil) as needed. Take your usually prescribed medications unless otherwise directed. If you need a refill on your pain medication, please contact your pharmacy.  They will contact our office to request authorization. Prescriptions will not be filled after 5pm or on week-ends. You should follow a light diet the first few days after arrival home, such as soup and crackers, etc.  Be sure to include lots of fluids daily. Most patients will experience some swelling and bruising in the area of the incisions.  Ice packs will help.  Swelling and bruising can take several days to resolve.  It is common to experience some constipation if taking pain medication after surgery.  Increasing fluid intake and taking a stool softener (such as Colace) will usually help or prevent this problem from occurring.  A mild laxative (Milk of Magnesia or Miralax) should be taken according to package instructions if there are no bowel movements after 48 hours. Unless discharge instructions indicate otherwise, you may remove your bandages 48 hours after surgery, and you may shower at that time.  You may have steri-strips (small skin tapes) in place directly over the incision.  These strips should be left on the skin for 7-10 days.  If your surgeon used skin glue on the incision, you may shower in 24 hours.  The glue will flake off over the next  2-3 weeks.  Any sutures or staples will be removed at the office during your follow-up visit. ACTIVITIES:  You may resume regular (light) daily activities beginning the next day--such as daily self-care, walking, climbing stairs--gradually increasing activities as tolerated.  You may have sexual intercourse when it is comfortable.  Refrain from any heavy lifting or straining until approved by your doctor. You may drive when you are no longer taking prescription pain medication, you can comfortably wear a seatbelt, and you can safely maneuver your car and apply brakes. RETURN TO WORK:  __________________________________________________________ You should see your doctor in the office for a follow-up appointment approximately 2-3 weeks after your surgery.  Make sure that you call for this appointment within a day or two after you arrive home to insure a convenient appointment time. OTHER INSTRUCTIONS: __________________________________________________________________________________________________________________________ __________________________________________________________________________________________________________________________ WHEN TO CALL YOUR DOCTOR: Fever over 101.0 Inability to urinate Continued bleeding from incision. Increased pain, redness, or drainage from the incision. Increasing abdominal pain  The clinic staff is available to answer your questions during regular business hours.  Please don't hesitate to call and ask to speak to one of the nurses for clinical concerns.  If you have a medical emergency, go to the nearest emergency room or call 911.  A surgeon from [facility] is always on call at the hospital. [address], [address], [place], [place]  [address] ? [address] [address], [place], [place]   [address] [phone] ? [phone] ? FAX [phone] Web site: [email or web address]  

## 2022-08-31 NOTE — Anesthesia Procedure Notes (Signed)
Anesthesia Regional Block: TAP block   Pre-Anesthetic Checklist: , timeout performed,  Correct Patient, Correct Site, Correct Laterality,  Correct Procedure, Correct Position, site marked,  Risks and benefits discussed,  Surgical consent,  Pre-op evaluation,  At surgeon's request and post-op pain management  Laterality: Left  Prep: chloraprep       Needles:  Injection technique: Single-shot  Needle Type: Echogenic Stimulator Needle     Needle Length: 9cm  Needle Gauge: 21     Additional Needles:   Procedures:,,,, ultrasound used (permanent image in chart),,    Narrative:  Start time: 08/31/2022 7:00 AM End time: 08/31/2022 7:05 AM Injection made incrementally with aspirations every 5 mL.  Performed by: Personally  Anesthesiologist: Effie Berkshire, MD  Additional Notes: Patient tolerated the procedure well. Local anesthetic introduced in an incremental fashion under minimal resistance after negative aspirations. No paresthesias were elicited. After completion of the procedure, no acute issues were identified and patient continued to be monitored by RN.

## 2022-08-31 NOTE — H&P (Signed)
  58 yof who has history of ruq pain. This has been worsening. She was evaluated before for this and had negative [place]. She now has ruq pain radiating to the back especially with eating. She is not eating as much now. She has n/v assocaited with this. Underwent US that shows cholelithiasis. Here to discuss options.   Review of Systems: A complete review of systems was obtained from the patient. I have reviewed this information and discussed as appropriate with the patient. See HPI as well for other ROS.  Review of Systems  Gastrointestinal: Positive for abdominal pain, nausea and vomiting.  Neurological: Positive for dizziness and headaches.    Medical History: Past Medical History:  Diagnosis Date  Anemia  Asthma, unspecified asthma severity, unspecified whether complicated, unspecified whether persistent   There is no problem list on file for this patient.  History reviewed. No pertinent surgical history.   No Known Allergies  No current outpatient medications on file prior to visit.   No current facility-administered medications on file prior to visit.   History reviewed. No pertinent family history.   Social History   Tobacco Use  Smoking Status Never  Smokeless Tobacco Never    Social History   Socioeconomic History  Marital status: Married  Tobacco Use  Smoking status: Never  Smokeless tobacco: Never  Substance and Sexual Activity  Alcohol use: Not Currently  Drug use: Never   Objective:   Vitals:  08/18/22 0955  BP: 108/66  Pulse: 97  Temp: 36.9 C (98.5 F)  SpO2: 98%  Weight: 59.6 kg (131 lb 6.4 oz)  Height: 160 cm (5\' 3" )   Body mass index is 23.28 kg/m.  Physical Exam Vitals reviewed.  Constitutional:  Appearance: Normal appearance.  Eyes:  General: No scleral icterus. Abdominal:  General: Abdomen is flat.  Palpations: Abdomen is soft.  Tenderness: There is abdominal tenderness (ruq).  Hernia: No hernia is present.  Neurological:   Mental Status: She is alert.     Assessment and Plan:   Diagnoses and all orders for this visit:  Gallstones  Lap chole  I do think that her symptoms are related to her gallbladder.  I discussed the procedure in detail. We discussed the risks and benefits of a laparoscopic cholecystectomy and possible cholangiogram including, but not limited to bleeding, infection, injury to surrounding structures such as the intestine or liver, bile leak, retained gallstones, need to convert to an open procedure, prolonged diarrhea, blood clots such as DVT, common bile duct injury, anesthesia risks, and possible need for additional procedures. The likelihood of improvement in symptoms and return to the patient's normal status is good. We discussed the typical post-operative recovery course.

## 2022-08-31 NOTE — Transfer of Care (Signed)
Immediate Anesthesia Transfer of Care Note  Patient: Makyra Corprew Tawney  Procedure(s) Performed: LAPAROSCOPIC CHOLECYSTECTOMY (Abdomen) INDOCYANINE GREEN FLUORESCENCE IMAGING (ICG) (Abdomen)  Patient Location: PACU  Anesthesia Type:General  Level of Consciousness: awake, alert  and oriented  Airway & Oxygen Therapy: Patient Spontanous Breathing  Post-op Assessment: Report given to RN and Post -op Vital signs reviewed and stable  Post vital signs: Reviewed and stable  Last Vitals:  Vitals Value Taken Time  BP 123/96 08/31/22 0836  Temp    Pulse 85 08/31/22 0838  Resp 20 08/31/22 0838  SpO2 99 % 08/31/22 0838  Vitals shown include unvalidated device data.  Last Pain:  Vitals:   08/31/22 0602  TempSrc: Oral  PainSc: 3       Patients Stated Pain Goal: 0 (32/67/12 4580)  Complications: No notable events documented.

## 2022-08-31 NOTE — Op Note (Addendum)
Preoperative diagnosis: Symptomatic cholelithiasis Postoperative diagnosis: Symptomatic cholelithiasis and chronic cholecystitis Procedure: Laparoscopic cholecystectomy Surgeon: Dr. Serita Grammes Anesthesia: General with bilateral tap block Specimens: Gallbladder and contents to pathology Complications: None Drains: None Sponge needle count was correct completion Disposition recovery stable condition  Indications: This a 61 old female history of worsening right upper quadrant pain.  She has some pain radiating to her back.  She has nausea and vomiting associated with this and is not able to eat as well.  She underwent an ultrasound that shows cholelithiasis.  I saw her in the office and it does appear she has symptomatic cholelithiasis.  We discussed a laparoscopic cholecystectomy.  Procedure: After informed consent was obtained she first underwent a bilateral tap block.  She was injected with indocyanine green dye.  She was given antibiotics.  SCDs were in place.  She was then placed under general anesthesia without complication.  She was prepped and draped in the standard sterile surgical fashion.  Surgical timeout was then performed.  I made a vertical incision after infiltration with Marcaine below the umbilicus.  I grasped the fascia.  I incised this sharply and entered the peritoneum bluntly without injury.  I placed a 0 Vicryl pursestring through this.  I then inserted a Hassan trocar and insufflated the abdomen to 15 mmHg pressure.  I then placed 3 additional 5 mm trocars in the epigastrium and right upper quadrant without difficulty.  The gallbladder was noted to have chronic cholecystitis.  It was retracted cephalad.  I had to release some adhesions to the duodenum.  I then retracted it lateral.  I was able to dissect in the triangle.  I was able to eventually identify the critical view of safety.  This was quite inflamed.  I used the ICG dye to confirm that I had identified the cystic duct  as well as the common hepatic and common bile duct.  I then clipped the artery and divided it leaving 2 clips in place.  There was a posterior branch that I treated in similar fashion.  I then was left with the cystic duct.  I clipped this 3 times and divided it leaving 2 clips in place.  The clips completely traversed the duct and the duct was viable.  I then remove the gallbladder from the liver bed.  This was placed in a retrieval bag and removed from the abdomen.  I then obtained hemostasis.  Irrigation was performed and this was clear.  I then removed my Hassan trocar and tied my pursestring down.  I placed 2 additional 2-0 Vicryl sutures to completely obliterate the umbilical defect.  I then desufflated the abdomen and remove the remaining trocars.  These were closed with 4-0 Monocryl and glue.  She tolerated this well was extubated and transferred recovery stable.

## 2022-08-31 NOTE — Anesthesia Procedure Notes (Signed)
Anesthesia Regional Block: TAP block   Pre-Anesthetic Checklist: , timeout performed,  Correct Patient, Correct Site, Correct Laterality,  Correct Procedure, Correct Position, site marked,  Risks and benefits discussed,  Surgical consent,  Pre-op evaluation,  At surgeon's request and post-op pain management  Laterality: Right  Prep: chloraprep       Needles:  Injection technique: Single-shot  Needle Type: Echogenic Stimulator Needle     Needle Length: 9cm  Needle Gauge: 21     Additional Needles:   Procedures:,,,, ultrasound used (permanent image in chart),,    Narrative:  Start time: 08/31/2022 6:55 AM End time: 08/31/2022 7:00 AM Injection made incrementally with aspirations every 5 mL.  Performed by: Personally  Anesthesiologist: Effie Berkshire, MD  Additional Notes: Patient tolerated the procedure well. Local anesthetic introduced in an incremental fashion under minimal resistance after negative aspirations. No paresthesias were elicited. After completion of the procedure, no acute issues were identified and patient continued to be monitored by RN.

## 2022-08-31 NOTE — Anesthesia Procedure Notes (Signed)
Procedure Name: Intubation Date/Time: 08/31/2022 7:38 AM  Performed by: Carolan Clines, CRNAPre-anesthesia Checklist: Patient identified, Emergency Drugs available, Suction available and Patient being monitored Patient Re-evaluated:Patient Re-evaluated prior to induction Oxygen Delivery Method: Circle System Utilized Preoxygenation: Pre-oxygenation with 100% oxygen Induction Type: IV induction Ventilation: Mask ventilation without difficulty Laryngoscope Size: Mac and 3 Grade View: Grade I Tube type: Oral Number of attempts: 1 Airway Equipment and Method: Stylet Placement Confirmation: ETT inserted through vocal cords under direct vision, positive ETCO2 and breath sounds checked- equal and bilateral Secured at: 21 cm Tube secured with: Tape Dental Injury: Teeth and Oropharynx as per pre-operative assessment

## 2022-08-31 NOTE — Anesthesia Preprocedure Evaluation (Addendum)
Anesthesia Evaluation  Patient identified by MRN, date of birth, ID band Patient awake    Reviewed: Allergy & Precautions, NPO status , Patient's Chart, lab work & pertinent test results  Airway Mallampati: II  TM Distance: >3 FB Neck ROM: Full    Dental  (+) Teeth Intact, Dental Advisory Given   Pulmonary asthma ,    breath sounds clear to auscultation       Cardiovascular negative cardio ROS   Rhythm:Regular Rate:Normal     Neuro/Psych  Headaches, negative psych ROS   GI/Hepatic negative GI ROS, Neg liver ROS,   Endo/Other  negative endocrine ROS  Renal/GU negative Renal ROS     Musculoskeletal negative musculoskeletal ROS (+)   Abdominal Normal abdominal exam  (+)   Peds  Hematology   Anesthesia Other Findings   Reproductive/Obstetrics                            Anesthesia Physical Anesthesia Plan  ASA: 2  Anesthesia Plan: General   Post-op Pain Management: Regional block*   Induction: Intravenous  PONV Risk Score and Plan: 4 or greater and Ondansetron, Dexamethasone, Midazolam, Scopolamine patch - Pre-op and TIVA  Airway Management Planned: Oral ETT  Additional Equipment: None  Intra-op Plan:   Post-operative Plan: Extubation in OR  Informed Consent: I have reviewed the patients History and Physical, chart, labs and discussed the procedure including the risks, benefits and alternatives for the proposed anesthesia with the patient or authorized representative who has indicated his/her understanding and acceptance.     Dental advisory given  Plan Discussed with: CRNA  Anesthesia Plan Comments:        Anesthesia Quick Evaluation

## 2022-08-31 NOTE — Anesthesia Postprocedure Evaluation (Signed)
Anesthesia Post Note  Patient: Benelli Winther Chio  Procedure(s) Performed: LAPAROSCOPIC CHOLECYSTECTOMY (Abdomen) INDOCYANINE GREEN FLUORESCENCE IMAGING (ICG) (Abdomen)     Patient location during evaluation: PACU Anesthesia Type: General Level of consciousness: awake and alert Pain management: pain level controlled Vital Signs Assessment: post-procedure vital signs reviewed and stable Respiratory status: spontaneous breathing, nonlabored ventilation, respiratory function stable and patient connected to nasal cannula oxygen Cardiovascular status: blood pressure returned to baseline and stable Postop Assessment: no apparent nausea or vomiting Anesthetic complications: no   No notable events documented.  Last Vitals:  Vitals:   08/31/22 0920 08/31/22 0930  BP: 97/69 92/66  Pulse: 61 (!) 57  Resp: 10 11  Temp:  36.6 C  SpO2: 96% 98%    Last Pain:  Vitals:   08/31/22 0930  TempSrc:   PainSc: [place] Evva Din

## 2022-09-01 ENCOUNTER — Encounter (HOSPITAL_COMMUNITY): Payer: Self-pay | Admitting: General Surgery

## 2022-09-01 LAB — SURGICAL PATHOLOGY

## 2022-10-06 NOTE — Progress Notes (Signed)
   PROVIDER:  DONNICE CARLIN BURY, MD  MRN: [id number] DOB: 11-17-1980 DATE OF ENCOUNTER: 10/06/2022 Interval History:     This is a 41 year old female who had symptomatic cholelithiasis.  She is undergone a laparoscopic cholecystectomy and is doing well without complications.    Physical Examination:   Physical Exam   Incisions all healing well without any evidence of infection   Assessment and Plan:     She is doing well and can return to full normal activity.  She can see me as needed.  MATTHEW CARLIN BURY, MD

## 2022-11-24 DIAGNOSIS — Z23 Encounter for immunization: Secondary | ICD-10-CM | POA: Diagnosis not present

## 2022-11-24 DIAGNOSIS — Z0000 Encounter for general adult medical examination without abnormal findings: Secondary | ICD-10-CM | POA: Diagnosis not present

## 2022-12-08 DIAGNOSIS — R7301 Impaired fasting glucose: Secondary | ICD-10-CM | POA: Diagnosis not present

## 2022-12-08 DIAGNOSIS — R5383 Other fatigue: Secondary | ICD-10-CM | POA: Diagnosis not present

## 2022-12-08 DIAGNOSIS — Z13220 Encounter for screening for lipoid disorders: Secondary | ICD-10-CM | POA: Diagnosis not present

## 2023-03-12 DIAGNOSIS — F4323 Adjustment disorder with mixed anxiety and depressed mood: Secondary | ICD-10-CM | POA: Diagnosis not present

## 2023-04-05 DIAGNOSIS — F4323 Adjustment disorder with mixed anxiety and depressed mood: Secondary | ICD-10-CM | POA: Diagnosis not present

## 2023-09-27 DIAGNOSIS — Z01419 Encounter for gynecological examination (general) (routine) without abnormal findings: Secondary | ICD-10-CM | POA: Diagnosis not present

## 2023-09-27 DIAGNOSIS — Z124 Encounter for screening for malignant neoplasm of cervix: Secondary | ICD-10-CM | POA: Diagnosis not present

## 2023-09-27 DIAGNOSIS — R319 Hematuria, unspecified: Secondary | ICD-10-CM | POA: Diagnosis not present

## 2023-09-27 DIAGNOSIS — Z1231 Encounter for screening mammogram for malignant neoplasm of breast: Secondary | ICD-10-CM | POA: Diagnosis not present

## 2023-09-27 DIAGNOSIS — Z6821 Body mass index (BMI) 21.0-21.9, adult: Secondary | ICD-10-CM | POA: Diagnosis not present

## 2023-10-09 DIAGNOSIS — L2089 Other atopic dermatitis: Secondary | ICD-10-CM | POA: Diagnosis not present

## 2023-11-13 DIAGNOSIS — M25561 Pain in right knee: Secondary | ICD-10-CM | POA: Diagnosis not present

## 2023-11-28 DIAGNOSIS — M25561 Pain in right knee: Secondary | ICD-10-CM | POA: Diagnosis not present

## 2023-12-03 DIAGNOSIS — Z0000 Encounter for general adult medical examination without abnormal findings: Secondary | ICD-10-CM | POA: Diagnosis not present

## 2023-12-03 DIAGNOSIS — R5383 Other fatigue: Secondary | ICD-10-CM | POA: Diagnosis not present

## 2023-12-03 DIAGNOSIS — L989 Disorder of the skin and subcutaneous tissue, unspecified: Secondary | ICD-10-CM | POA: Diagnosis not present

## 2023-12-10 DIAGNOSIS — M25561 Pain in right knee: Secondary | ICD-10-CM | POA: Diagnosis not present

## 2024-07-28 ENCOUNTER — Emergency Department (HOSPITAL_COMMUNITY)
Admission: EM | Admit: 2024-07-28 | Discharge: 2024-07-28 | Disposition: A | Discharge status: Home / Self Care | Attending: Emergency Medicine | Admitting: Emergency Medicine

## 2024-07-28 ENCOUNTER — Emergency Department (HOSPITAL_COMMUNITY)

## 2024-07-28 ENCOUNTER — Encounter (HOSPITAL_COMMUNITY): Payer: Self-pay

## 2024-07-28 ENCOUNTER — Other Ambulatory Visit: Payer: Self-pay

## 2024-07-28 DIAGNOSIS — J45909 Unspecified asthma, uncomplicated: Secondary | ICD-10-CM | POA: Diagnosis not present

## 2024-07-28 DIAGNOSIS — M25511 Pain in right shoulder: Secondary | ICD-10-CM | POA: Diagnosis not present

## 2024-07-28 DIAGNOSIS — M546 Pain in thoracic spine: Secondary | ICD-10-CM | POA: Insufficient documentation

## 2024-07-28 DIAGNOSIS — R9431 Abnormal electrocardiogram [ECG] [EKG]: Secondary | ICD-10-CM | POA: Diagnosis not present

## 2024-07-28 DIAGNOSIS — J9811 Atelectasis: Secondary | ICD-10-CM | POA: Diagnosis not present

## 2024-07-28 LAB — COMPREHENSIVE METABOLIC PANEL WITH GFR
ALT: 8 U/L (ref 0–44)
AST: 22 U/L (ref 15–41)
Albumin: 4.7 g/dL (ref 3.5–5.0)
Alkaline Phosphatase: 72 U/L (ref 38–126)
Anion gap: 12 (ref 5–15)
BUN: 6 mg/dL (ref 6–20)
CO2: 23 mmol/L (ref 22–32)
Calcium: 9.5 mg/dL (ref 8.9–10.3)
Chloride: 103 mmol/L (ref 98–111)
Creatinine, Ser: 0.7 mg/dL (ref 0.44–1.00)
GFR, Estimated: >60 mL/min (ref >60)
Glucose, Bld: 105 mg/dL — ABNORMAL HIGH (ref 70–99)
Potassium: 3.8 mmol/L (ref 3.5–5.1)
Sodium: 138 mmol/L (ref 135–145)
Total Bilirubin: 0.6 mg/dL (ref 0.0–1.2)
Total Protein: 7.6 g/dL (ref 6.5–8.1)

## 2024-07-28 LAB — CBC WITH DIFFERENTIAL/PLATELET
Abs Immature Granulocytes: 0.01 K/uL (ref 0.00–0.07)
Basophils Absolute: 0.1 K/uL (ref 0.0–0.1)
Basophils Relative: 1 %
Eosinophils Absolute: 0 K/uL (ref 0.0–0.5)
Eosinophils Relative: 0 %
HCT: 41.7 % (ref 36.0–46.0)
Hemoglobin: 13.4 g/dL (ref 12.0–15.0)
Immature Granulocytes: 0 %
Lymphocytes Relative: 17 %
Lymphs Abs: 1.2 K/uL (ref 0.7–4.0)
MCH: 28 pg (ref 26.0–34.0)
MCHC: 32.1 g/dL (ref 30.0–36.0)
MCV: 87.2 fL (ref 80.0–100.0)
Monocytes Absolute: 0.3 K/uL (ref 0.1–1.0)
Monocytes Relative: 5 %
Neutro Abs: 5.5 K/uL (ref 1.7–7.7)
Neutrophils Relative %: 77 %
Platelets: 244 K/uL (ref 150–400)
RBC: 4.78 MIL/uL (ref 3.87–5.11)
RDW: 13.8 % (ref 11.5–15.5)
WBC: 7.1 K/uL (ref 4.0–10.5)
nRBC: 0 % (ref 0.0–0.2)

## 2024-07-28 LAB — TROPONIN T, HIGH SENSITIVITY
Troponin T High Sensitivity: <15 ng/L (ref 0–19)
Troponin T High Sensitivity: <15 ng/L (ref 0–19)

## 2024-07-28 LAB — LIPASE, BLOOD: Lipase: 47 U/L (ref 11–51)

## 2024-07-28 LAB — HCG, SERUM, QUALITATIVE: Preg, Serum: NEGATIVE

## 2024-07-28 LAB — MAGNESIUM: Magnesium: 2.1 mg/dL (ref 1.7–2.4)

## 2024-07-28 MED ORDER — IOHEXOL 350 MG/ML SOLN
75.0000 mL | Freq: Once | INTRAVENOUS | Status: AC | PRN
  Administered 2024-07-28: 75 mL via INTRAVENOUS

## 2024-07-28 NOTE — ED Provider Notes (Signed)
  EMERGENCY DEPARTMENT AT [facility] Provider Note   CSN: [id number] Arrival date & time: 07/28/24  1116     Patient presents with: Shoulder Pain   Brandi Suarez is a 43 y.o. female.    Shoulder Pain Associated symptoms: back pain   Patient presents for right posterior shoulder pain.  Medical history includes asthma, anemia.  This morning, patient was in her normal state of health.  At around 8:15 AM, she had sudden onset of a pain in area just medial to right shoulder blade.  She subsequently developed lightheadedness, shortness of breath, nausea, and clamminess.  Near syncopal symptoms subsided and patient's pain improved mildly.  She has had ongoing sharp pain in medial area of upper right back that is worsened with range of motion of her right shoulder as well as deep inspiration.  She denies any prior issues with her right shoulder.      Prior to Admission medications   Medication Sig Start Date End Date Taking? Authorizing Provider  ibuprofen  (ADVIL ) 200 MG tablet Take 400-600 mg by mouth every 8 (eight) hours as needed (pain.).    [provider]  traMADol  (ULTRAM ) 50 MG tablet Take 1 tablet (50 mg total) by mouth every 6 (six) hours as needed. 08/31/22   Ebbie Cough, MD    Allergies: Patient has no known allergies.    Review of Systems  Gastrointestinal:  Positive for nausea.  Musculoskeletal:  Positive for back pain.  Neurological:  Positive for dizziness and light-headedness.  All other systems reviewed and are negative.   Updated Vital Signs BP 130/85 (BP Location: Right Arm)   Pulse 72   Temp 97.8 F (36.6 C) (Oral)   Resp 16   SpO2 100%   Physical Exam Vitals and nursing note reviewed.  Constitutional:      General: She is not in acute distress.    Appearance: Normal appearance. She is well-developed. She is not ill-appearing, toxic-appearing or diaphoretic.  HENT:     Head: Normocephalic and atraumatic.      Right Ear: External ear normal.     Left Ear: External ear normal.     Nose: Nose normal.     Mouth/Throat:     Mouth: Mucous membranes are moist.  Eyes:     Extraocular Movements: Extraocular movements intact.     Conjunctiva/sclera: Conjunctivae normal.  Cardiovascular:     Rate and Rhythm: Normal rate and regular rhythm.  Pulmonary:     Effort: Pulmonary effort is normal. No respiratory distress.     Breath sounds: Normal breath sounds. No wheezing or rales.  Abdominal:     General: There is no distension.     Palpations: Abdomen is soft.     Tenderness: There is no abdominal tenderness.  Musculoskeletal:        General: No swelling, deformity or signs of injury.     Cervical back: Normal range of motion and neck supple.  Skin:    General: Skin is warm and dry.     Coloration: Skin is not jaundiced or pale.  Neurological:     General: No focal deficit present.     Mental Status: She is alert and oriented to person, place, and time.  Psychiatric:        Mood and Affect: Mood normal.        Behavior: Behavior normal.     (all labs ordered are listed, but only abnormal results are displayed) Labs Reviewed  COMPREHENSIVE  METABOLIC PANEL WITH GFR - Abnormal; Notable for the following components:      Result Value   Glucose, Bld 105 (*)    All other components within normal limits  HCG, SERUM, QUALITATIVE  MAGNESIUM  LIPASE, BLOOD  CBC WITH DIFFERENTIAL/PLATELET  TROPONIN T, HIGH SENSITIVITY  TROPONIN T, HIGH SENSITIVITY    EKG: EKG Interpretation Date/Time:  Monday July 28 2024 12:16:25 EDT Ventricular Rate:  70 PR Interval:  159 QRS Duration:  75 QT Interval:  401 QTC Calculation: 433 R Axis:   73  Text Interpretation: Sinus rhythm Borderline T abnormalities, lateral leads Confirmed by Ula Barter [phone]) on 07/28/2024 12:22:21 PM  Radiology: CT Angio Chest PE W and/or Wo Contrast Result Date: 07/28/2024 CLINICAL DATA:  Pulmonary embolism (PE) suspected,  high prob EXAM: CT ANGIOGRAPHY CHEST WITH CONTRAST TECHNIQUE: Multidetector CT imaging of the chest was performed using the standard protocol during bolus administration of intravenous contrast. Multiplanar CT image reconstructions and MIPs were obtained to evaluate the vascular anatomy. RADIATION DOSE REDUCTION: This exam was performed according to the departmental dose-optimization program which includes automated exposure control, adjustment of the mA and/or kV according to patient size and/or use of iterative reconstruction technique. CONTRAST:  75mL OMNIPAQUE  IOHEXOL  350 MG/ML SOLN COMPARISON:  07/28/2024 FINDINGS: Pulmonary Embolism: No pulmonary embolism. Cardiovascular: No cardiomegaly or pericardial effusion.No aortic aneurysm. Mediastinum/Nodes: No mediastinal mass.No mediastinal, hilar, or axillary lymphadenopathy. Lungs/Pleura: The midline trachea and bronchi are patent. Posterior bibasilar dependent atelectasis. No focal airspace consolidation, pleural effusion, or pneumothorax. Musculoskeletal: No acute fracture or destructive bone lesion. Multilevel thoracic osteophytosis. Upper Abdomen: No acute abnormality in the partially visualized upper abdomen. Cholecystectomy clips. Review of the MIP images confirms the above findings. IMPRESSION: No acute intrathoracic abnormality; specifically, no pulmonary embolism, pneumonia, or pleural effusion. Electronically Signed   By: Rogelia Myers M.D.   On: 07/28/2024 14:32   DG Chest Portable 1 View Result Date: 07/28/2024 EXAM: 1 VIEW XRAY OF THE CHEST 07/28/2024 12:06:00 PM COMPARISON: None available. CLINICAL HISTORY: Per notes- Pt came in for right shoulder pain that started today. Pt stated its a sharp sensation and she's not able to move her arm as much. Pt stated she's also not able to take deep breaths. When the pain initially started, pt stated she developed tunnel vision, ringing in her ears, hot/cold sweats. FINDINGS: LUNGS AND PLEURA: No focal  pulmonary opacity. No pulmonary edema. No pleural effusion. No pneumothorax. HEART AND MEDIASTINUM: No acute abnormality of the cardiac and mediastinal silhouettes. BONES AND SOFT TISSUES: No acute osseous abnormality. IMPRESSION: 1. No acute process. Electronically signed by: Selinda Blue MD 07/28/2024 12:50 PM EDT RP Workstation: [id number]     Procedures   Medications Ordered in the ED  iohexol  (OMNIPAQUE ) 350 MG/ML injection 75 mL (75 mLs Intravenous Contrast Given 07/28/24 1343)                                    Medical Decision Making Amount and/or Complexity of Data Reviewed Labs: ordered. Radiology: ordered.  Risk Prescription drug management.   This patient presents to the ED for concern of back pain, this involves an extensive number of treatment options, and is a complaint that carries with it a high risk of complications and morbidity.  The differential diagnosis includes PE, pneumothorax, musculoskeletal etiology, ACS   Co morbidities / Chronic conditions that complicate the patient evaluation  Asthma, anemia  Additional history obtained:  Additional history obtained from EMR External records from outside source obtained and reviewed including N/A   Lab Tests:  I Ordered, and personally interpreted labs.  The pertinent results include: Normal hemoglobin, no leukocytosis, normal kidney function, normal electrolytes, normal hepatobiliary enzymes   Imaging Studies ordered:  I ordered imaging studies including x-ray, CTA chest I independently visualized and interpreted imaging which showed no acute findings I agree with the radiologist interpretation   Cardiac Monitoring: / EKG:  The patient was maintained on a cardiac monitor.  I personally viewed and interpreted the cardiac monitored which showed an underlying rhythm of: Sinus rhythm   Problem List / ED Course / Critical interventions / Medication management  Patient presents for acute onset of  shoulder blade pain that was accompanied initially by near syncopal symptoms.  Onset of pain was 3 hours prior to arrival.  She has since had ongoing pain in area medial to right shoulder blade that is worsened with shoulder range of motion and deep inspiration.  On arrival in the ED, patient is well-appearing.  She is able to range her right shoulder but does have pain in shoulder blade area with shoulder flexion and abduction greater than 90 degrees.  Distal extremity is neurovascularly intact.  She has some mild tenderness medial to shoulder blade present on exam.  Lungs are clear to auscultation.  Patient declines pain medication.  Patient was placed on monitor and workup was initiated.  Patient's lab work is reassuring with normal hepatobiliary enzymes, normal troponin, no leukocytosis.  She underwent CTA chest which did not show any acute findings.  I suspect musculoskeletal etiology.  Patient was given reassurance.  She declines any prescriptions for pain medication.  She was discharged in stable condition.   Social Determinants of Health:  Has PCP     Final diagnoses:  Acute right-sided thoracic back pain    ED Discharge Orders     None          Melvenia Motto, MD 07/28/24 1504

## 2024-07-28 NOTE — ED Triage Notes (Signed)
 Pt came in for right shoulder pain that started today. Pt stated its a sharp sensation and she's not able to move her arm as much. Pt stated she's also not able to take deep breaths. When the pain initially started, pt stated she developed tunnel vision, ringing in her ears, hot/cold sweats.

## 2024-07-28 NOTE — ED Notes (Signed)
 Discharge instructions, medications, and follow up care reviewed with and provided to pt. Pt denies any further questions, and has verbalized understanding.

## 2024-07-28 NOTE — Discharge Instructions (Signed)
 Your test results today are reassuring.  Treat pain with ibuprofen  and Tylenol  as needed.  Return to the emergency department for any new or worsening symptoms of concern.
